# Patient Record
Sex: Female | Born: 1950
Health system: Southern US, Community
[De-identification: ages and names within clinical notes are randomized; demographics above are authoritative.]

## PROBLEM LIST (undated history)

## (undated) DIAGNOSIS — K219 Gastro-esophageal reflux disease without esophagitis: Secondary | ICD-10-CM

## (undated) DIAGNOSIS — F419 Anxiety disorder, unspecified: Secondary | ICD-10-CM

## (undated) DIAGNOSIS — F32A Depression, unspecified: Secondary | ICD-10-CM

## (undated) DIAGNOSIS — C801 Malignant (primary) neoplasm, unspecified: Secondary | ICD-10-CM

## (undated) DIAGNOSIS — F329 Major depressive disorder, single episode, unspecified: Secondary | ICD-10-CM

## (undated) HISTORY — DX: Depression, unspecified: F32.A

## (undated) HISTORY — PX: VAGINAL HYSTERECTOMY: SHX2639

## (undated) HISTORY — PX: ABDOMINAL HYSTERECTOMY: SHX81

## (undated) HISTORY — DX: Major depressive disorder, single episode, unspecified: F32.9

## (undated) HISTORY — DX: Anxiety disorder, unspecified: F41.9

## (undated) HISTORY — PX: CHOLECYSTECTOMY: SHX55

---

## 1989-08-26 DIAGNOSIS — C801 Malignant (primary) neoplasm, unspecified: Secondary | ICD-10-CM

## 1989-08-26 HISTORY — DX: Malignant (primary) neoplasm, unspecified: C80.1

## 2004-07-03 ENCOUNTER — Ambulatory Visit: Payer: Self-pay | Admitting: Ophthalmology

## 2015-03-24 ENCOUNTER — Other Ambulatory Visit: Payer: Self-pay

## 2015-03-28 ENCOUNTER — Ambulatory Visit (INDEPENDENT_AMBULATORY_CARE_PROVIDER_SITE_OTHER): Payer: Commercial Managed Care - HMO | Admitting: Family Medicine

## 2015-03-28 ENCOUNTER — Encounter: Payer: Self-pay | Admitting: Family Medicine

## 2015-03-28 VITALS — BP 120/80 | HR 80 | Ht 59.0 in | Wt 130.0 lb

## 2015-03-28 DIAGNOSIS — F331 Major depressive disorder, recurrent, moderate: Secondary | ICD-10-CM

## 2015-03-28 DIAGNOSIS — F419 Anxiety disorder, unspecified: Secondary | ICD-10-CM | POA: Insufficient documentation

## 2015-03-28 MED ORDER — AMITRIPTYLINE HCL 50 MG PO TABS: 50.0000 mg | ORAL_TABLET | Freq: Every day | ORAL | Status: DC

## 2015-03-28 MED ORDER — BUSPIRONE HCL 15 MG PO TABS: 15.0000 mg | ORAL_TABLET | Freq: Every day | ORAL | Status: DC

## 2015-03-28 MED ORDER — CITALOPRAM HYDROBROMIDE 20 MG PO TABS: 20.0000 mg | ORAL_TABLET | Freq: Every day | ORAL | Status: DC

## 2015-03-28 NOTE — Addendum Note (Signed)
Addended by: Juline Patch on: 03/28/2015 02:34 PM   Modules accepted: Orders

## 2015-03-28 NOTE — Progress Notes (Signed)
Name: Michelle Willis   MRN: [id number]    DOB: 01-15-1951   Date:03/28/2015       Progress Note  Subjective  Chief Complaint  Chief Complaint  Patient presents with  . Depression    moved from other doctor- wants referral to [facility] Problem The primary symptoms do not include dysphoric mood, delusions, hallucinations, bizarre behavior, disorganized speech, negative symptoms or somatic symptoms. The current episode started more than 1 month ago.  The onset of the illness is precipitated by emotional stress. The degree of incapacity that she is experiencing as a consequence of her illness is moderate. Additional symptoms of the illness do not include no anhedonia, no insomnia, no hypersomnia, no appetite change, no unexpected weight change, no fatigue, no agitation, no psychomotor retardation, no feelings of worthlessness, no attention impairment, no euphoric mood, no increased goal-directed activity, no flight of ideas, no inflated self-esteem, no decreased need for sleep, not distractible, no poor judgment, no visual change, no headaches, no abdominal pain or no seizures. She does not admit to suicidal ideas. She does not have a plan to commit suicide. She does not contemplate harming herself. Risk factors that are present for mental illness include a history of mental illness.  Anxiety Presents for follow-up visit. Onset was 1 to 5 years ago. Patient reports no chest pain, compulsions, confusion, decreased concentration, depressed mood, dizziness, dry mouth, excessive worry, feeling of choking, hyperventilation, impotence, insomnia, irritability, malaise, muscle tension, nausea, nervous/anxious behavior, obsessions, palpitations, panic, restlessness, shortness of breath or suicidal ideas. Symptoms occur most days. The severity of symptoms is moderate. Nothing aggravates the symptoms. The quality of sleep is good.   There are no known risk factors. There is no history of  anemia, anxiety/panic attacks, arrhythmia, asthma, bipolar disorder, CAD, CHF, chronic lung disease, depression, fibromyalgia, hyperthyroidism or suicide attempts. Past treatments include SSRIs and non-benzodiazephine anxiolytics. Compliance with prior treatments has been good.    No problem-specific assessment & plan notes found for this encounter.   Past Medical History  Diagnosis Date  . Anxiety   . Depression     Past Surgical History  Procedure Laterality Date  . Vaginal hysterectomy    . Cholecystectomy      Family History  Problem Relation Age of Onset  . Cancer Father     History   Social History  . Marital Status: Unknown    Spouse Name: N/A  . Number of Children: N/A  . Years of Education: N/A   Occupational History  . Not on file.   Social History Main Topics  . Smoking status: Never Smoker   . Smokeless tobacco: Not on file  . Alcohol Use: No  . Drug Use: No  . Sexual Activity: Not Currently   Other Topics Concern  . Not on file   Social History Narrative  . No narrative on file    No Known Allergies   Review of Systems  Constitutional: Negative for fever, chills, weight loss, malaise/fatigue, appetite change, irritability, fatigue and unexpected weight change.  HENT: Negative for ear discharge, ear pain and sore throat.   Eyes: Negative for blurred vision.  Respiratory: Negative for cough, sputum production, shortness of breath and wheezing.   Cardiovascular: Negative for chest pain, palpitations and leg swelling.  Gastrointestinal: Negative for heartburn, nausea, abdominal pain, diarrhea, constipation, blood in stool and melena.  Genitourinary: Negative for dysuria, urgency, frequency, hematuria and impotence.  Musculoskeletal: Negative for myalgias, back pain, joint  pain and neck pain.  Skin: Negative for rash.  Neurological: Negative for dizziness, tingling, sensory change, focal weakness, seizures and headaches.  Endo/Heme/Allergies:  Negative for environmental allergies and polydipsia. Does not bruise/bleed easily.  Psychiatric/Behavioral: Negative for depression, suicidal ideas, hallucinations, confusion, dysphoric mood, decreased concentration and agitation. The patient is not nervous/anxious and does not have insomnia.      Objective  Filed Vitals:   03/28/15 1342  BP: 120/80  Pulse: 80  Height: 4\' 11"  (1.499 m)  Weight: 130 lb (58.968 kg)    Physical Exam  Constitutional: She is well-developed, well-nourished, and in no distress. No distress.  HENT:  Head: Normocephalic and atraumatic.  Right Ear: External ear normal.  Left Ear: External ear normal.  Nose: Nose normal.  Mouth/Throat: Oropharynx is clear and moist.  Eyes: Conjunctivae and EOM are normal. Pupils are equal, round, and reactive to light. Right eye exhibits no discharge. Left eye exhibits no discharge.  Neck: Normal range of motion. Neck supple. No JVD present. No thyromegaly present.  Cardiovascular: Normal rate, regular rhythm, normal heart sounds and intact distal pulses.  Exam reveals no gallop and no friction rub.   No murmur heard. Pulmonary/Chest: Effort normal and breath sounds normal.  Abdominal: Soft. Bowel sounds are normal. She exhibits no mass. There is no tenderness. There is no guarding.  Musculoskeletal: Normal range of motion. She exhibits no edema.  Lymphadenopathy:    She has no cervical adenopathy.  Neurological: She is alert. She has normal reflexes.  Skin: Skin is warm and dry. She is not diaphoretic.  Psychiatric: Mood and affect normal.  Nursing note and vitals reviewed.     Assessment & Plan  Problem List Items Addressed This Visit      Other   Major depressive disorder, recurrent episode, moderate - Primary   Relevant Medications   amitriptyline (ELAVIL) 50 MG tablet   busPIRone (BUSPAR) 15 MG tablet   citalopram (CELEXA) 20 MG tablet   Acute anxiety   Relevant Medications   amitriptyline (ELAVIL) 50 MG  tablet   busPIRone (BUSPAR) 15 MG tablet   citalopram (CELEXA) 20 MG tablet        Dr. Letanya Froh [facility]  03/28/2015

## 2015-04-11 DIAGNOSIS — F331 Major depressive disorder, recurrent, moderate: Secondary | ICD-10-CM | POA: Diagnosis not present

## 2015-09-25 ENCOUNTER — Other Ambulatory Visit: Payer: Self-pay | Admitting: Family Medicine

## 2015-09-25 DIAGNOSIS — Z1231 Encounter for screening mammogram for malignant neoplasm of breast: Secondary | ICD-10-CM

## 2015-10-05 ENCOUNTER — Ambulatory Visit: Payer: Self-pay | Attending: Family Medicine

## 2016-01-31 ENCOUNTER — Ambulatory Visit: Payer: Self-pay | Admitting: Psychiatry

## 2016-02-15 ENCOUNTER — Telehealth: Payer: Self-pay

## 2016-02-15 ENCOUNTER — Ambulatory Visit (INDEPENDENT_AMBULATORY_CARE_PROVIDER_SITE_OTHER): Payer: Self-pay | Admitting: Psychiatry

## 2016-02-20 NOTE — Telephone Encounter (Signed)
error 

## 2016-04-10 ENCOUNTER — Other Ambulatory Visit: Payer: Self-pay | Admitting: Family Medicine

## 2016-04-10 DIAGNOSIS — F331 Major depressive disorder, recurrent, moderate: Secondary | ICD-10-CM

## 2016-04-15 ENCOUNTER — Ambulatory Visit: Payer: Self-pay | Admitting: Family Medicine

## 2016-04-26 ENCOUNTER — Ambulatory Visit: Payer: Self-pay | Admitting: Family Medicine

## 2016-06-05 ENCOUNTER — Ambulatory Visit: Payer: Self-pay | Admitting: Family Medicine

## 2016-06-25 ENCOUNTER — Ambulatory Visit (INDEPENDENT_AMBULATORY_CARE_PROVIDER_SITE_OTHER): Payer: Commercial Managed Care - HMO | Admitting: Family Medicine

## 2016-06-25 ENCOUNTER — Encounter: Payer: Self-pay | Admitting: Family Medicine

## 2016-06-25 VITALS — BP 130/98 | HR 88 | Ht 59.0 in | Wt 127.0 lb

## 2016-06-25 DIAGNOSIS — Z1231 Encounter for screening mammogram for malignant neoplasm of breast: Secondary | ICD-10-CM

## 2016-06-25 DIAGNOSIS — B354 Tinea corporis: Secondary | ICD-10-CM

## 2016-06-25 DIAGNOSIS — Z0000 Encounter for general adult medical examination without abnormal findings: Secondary | ICD-10-CM | POA: Diagnosis not present

## 2016-06-25 DIAGNOSIS — Z1239 Encounter for other screening for malignant neoplasm of breast: Secondary | ICD-10-CM

## 2016-06-25 DIAGNOSIS — I10 Essential (primary) hypertension: Secondary | ICD-10-CM | POA: Diagnosis not present

## 2016-06-25 MED ORDER — CLOTRIMAZOLE-BETAMETHASONE 1-0.05 % EX CREA: 1.0000 "application " | TOPICAL_CREAM | Freq: Two times a day (BID) | CUTANEOUS | 0 refills | Status: DC

## 2016-06-25 MED ORDER — LISINOPRIL 10 MG PO TABS: 10.0000 mg | ORAL_TABLET | Freq: Every day | ORAL | 2 refills | Status: DC

## 2016-06-25 NOTE — Progress Notes (Signed)
Name: Michelle Willis   MRN: [id number]    DOB: May 08, 1951   Date:06/25/2016       Progress Note  Subjective  Chief Complaint  Chief Complaint  Patient presents with  . Annual Exam    no pap/ mammo needed-- never had one  . Rash    across abdomen- itches    Patient needs breast exam with mammogram.   Rash  This is a new problem. The current episode started more than 1 month ago. The problem has been waxing and waning since onset. The affected locations include the chest. The rash is characterized by itchiness and redness. She was exposed to nothing. Pertinent negatives include no anorexia, congestion, cough, diarrhea, eye pain, facial edema, fatigue, fever, joint pain, nail changes, rhinorrhea, shortness of breath, sore throat or vomiting. Past treatments include topical steroids. The treatment provided no relief.  Hypertension  This is a recurrent problem. The current episode started more than 1 year ago. The problem has been waxing and waning since onset. The problem is controlled. Pertinent negatives include no anxiety, blurred vision, chest pain, headaches, malaise/fatigue, neck pain, orthopnea, palpitations, peripheral edema, PND or shortness of breath. There are no associated agents to hypertension. Past treatments include nothing.    No problem-specific Assessment & Plan notes found for this encounter.   Past Medical History:  Diagnosis Date  . Anxiety   . Depression     Past Surgical History:  Procedure Laterality Date  . CHOLECYSTECTOMY    . VAGINAL HYSTERECTOMY      Family History  Problem Relation Age of Onset  . Cancer Father     Social History   Social History  . Marital status: Unknown    Spouse name: N/A  . Number of children: N/A  . Years of education: N/A   Occupational History  . Not on file.   Social History Main Topics  . Smoking status: Never Smoker  . Smokeless tobacco: Never Used  . Alcohol use No  . Drug use: No  . Sexual activity: Not  Currently   Other Topics Concern  . Not on file   Social History Narrative  . No narrative on file    No Known Allergies   Review of Systems  Constitutional: Negative for chills, fatigue, fever, malaise/fatigue and weight loss.  HENT: Negative for congestion, ear discharge, ear pain, rhinorrhea and sore throat.   Eyes: Negative for blurred vision and pain.  Respiratory: Negative for cough, sputum production, shortness of breath and wheezing.   Cardiovascular: Negative for chest pain, palpitations, orthopnea, leg swelling and PND.  Gastrointestinal: Negative for abdominal pain, anorexia, blood in stool, constipation, diarrhea, heartburn, melena, nausea and vomiting.  Genitourinary: Negative for dysuria, frequency, hematuria and urgency.  Musculoskeletal: Negative for back pain, joint pain, myalgias and neck pain.  Skin: Positive for rash. Negative for nail changes.  Neurological: Negative for dizziness, tingling, sensory change, focal weakness and headaches.  Endo/Heme/Allergies: Negative for environmental allergies and polydipsia. Does not bruise/bleed easily.  Psychiatric/Behavioral: Negative for depression and suicidal ideas. The patient is not nervous/anxious and does not have insomnia.      Objective  Vitals:   06/25/16 0949  BP: (!) 130/98  Pulse: 88  Weight: 127 lb (57.6 kg)  Height: 4\' 11"  (1.499 m)    Physical Exam  Constitutional: She is well-developed, well-nourished, and in no distress. No distress.  HENT:  Head: Normocephalic and atraumatic.  Right Ear: Tympanic membrane, external ear and ear canal normal.  Left Ear: Tympanic membrane, external ear and ear canal normal.  Nose: Nose normal.  Mouth/Throat: Oropharynx is clear and moist.  Eyes: Conjunctivae and EOM are normal. Pupils are equal, round, and reactive to light. Right eye exhibits no discharge. Left eye exhibits no discharge.  Neck: Normal range of motion. Neck supple. No JVD present. No thyromegaly  present.  Cardiovascular: Normal rate, regular rhythm, normal heart sounds and intact distal pulses.  Exam reveals no gallop and no friction rub.   No murmur heard. Pulmonary/Chest: Effort normal and breath sounds normal. She has no wheezes. She has no rales. Right breast exhibits no inverted nipple, no mass, no nipple discharge, no skin change and no tenderness. Left breast exhibits no inverted nipple, no mass, no nipple discharge, no skin change and no tenderness. Breasts are symmetrical.  Abdominal: Soft. Bowel sounds are normal. She exhibits no mass. There is no tenderness. There is no guarding.  Musculoskeletal: Normal range of motion. She exhibits no edema.  Lymphadenopathy:    She has no cervical adenopathy.  Neurological: She is alert. She has normal reflexes.  Skin: Skin is warm and dry. Rash noted. She is not diaphoretic. There is erythema.  Psychiatric: Mood and affect normal.  Nursing note and vitals reviewed.     Assessment & Plan  Problem List Items Addressed This Visit    None    Visit Diagnoses    Essential hypertension    -  Primary   Relevant Medications   lisinopril (PRINIVIL,ZESTRIL) 10 MG tablet   Other Relevant Orders   Renal Function Panel   Tinea corporis       Relevant Medications   clotrimazole-betamethasone (LOTRISONE) cream   Breast cancer screening       Relevant Orders   MM Digital Screening        Dr. Otilio Miu [facility]  06/25/16

## 2016-06-26 LAB — RENAL FUNCTION PANEL
ALBUMIN: 4.4 g/dL (ref 3.6–4.8)
BUN/Creatinine Ratio: 19 (ref 12–28)
BUN: 18 mg/dL (ref 8–27)
CALCIUM: 9.7 mg/dL (ref 8.7–10.3)
CO2: 27 mmol/L (ref 18–29)
CREATININE: 0.93 mg/dL (ref 0.57–1.00)
Chloride: 101 mmol/L (ref 96–106)
GFR calc Af Amer: 74 mL/min/{1.73_m2} (ref >59)
GFR, EST NON AFRICAN AMERICAN: 64 mL/min/{1.73_m2} (ref >59)
Glucose: 86 mg/dL (ref 65–99)
PHOSPHORUS: 3.6 mg/dL (ref 2.5–4.5)
Potassium: 4.3 mmol/L (ref 3.5–5.2)
SODIUM: 144 mmol/L (ref 134–144)

## 2016-07-03 ENCOUNTER — Ambulatory Visit: Admission: RE | Admit: 2016-07-03 | Payer: Commercial Managed Care - HMO | Source: Ambulatory Visit

## 2016-07-08 ENCOUNTER — Ambulatory Visit
Admission: RE | Admit: 2016-07-08 | Discharge: 2016-07-08 | Disposition: A | Discharge status: Home / Self Care | Payer: Commercial Managed Care - HMO | Source: Ambulatory Visit | Attending: Family Medicine | Admitting: Family Medicine

## 2016-07-08 DIAGNOSIS — Z1231 Encounter for screening mammogram for malignant neoplasm of breast: Secondary | ICD-10-CM | POA: Diagnosis not present

## 2016-07-08 DIAGNOSIS — Z1239 Encounter for other screening for malignant neoplasm of breast: Secondary | ICD-10-CM

## 2016-08-06 ENCOUNTER — Other Ambulatory Visit: Payer: Self-pay

## 2016-08-06 DIAGNOSIS — F331 Major depressive disorder, recurrent, moderate: Secondary | ICD-10-CM

## 2016-08-06 DIAGNOSIS — F419 Anxiety disorder, unspecified: Secondary | ICD-10-CM

## 2016-08-07 ENCOUNTER — Telehealth: Payer: Self-pay

## 2016-08-07 NOTE — Telephone Encounter (Signed)
Pt called wanting 3 psych meds refilled- was told we could fill x 30 days but needs to get back in with psych per Dr Ronnald Ramp- sent Rx to [facility]

## 2016-08-08 ENCOUNTER — Ambulatory Visit (INDEPENDENT_AMBULATORY_CARE_PROVIDER_SITE_OTHER): Payer: Commercial Managed Care - HMO | Admitting: Family Medicine

## 2016-08-08 ENCOUNTER — Encounter: Payer: Self-pay | Admitting: Family Medicine

## 2016-08-08 VITALS — BP 130/90 | HR 80 | Ht 59.0 in | Wt 128.0 lb

## 2016-08-08 DIAGNOSIS — I10 Essential (primary) hypertension: Secondary | ICD-10-CM | POA: Diagnosis not present

## 2016-08-08 DIAGNOSIS — F331 Major depressive disorder, recurrent, moderate: Secondary | ICD-10-CM

## 2016-08-08 MED ORDER — LISINOPRIL 5 MG PO TABS: 5.0000 mg | ORAL_TABLET | Freq: Every day | ORAL | 1 refills | Status: DC

## 2016-08-08 MED ORDER — CITALOPRAM HYDROBROMIDE 10 MG PO TABS
10.0000 mg | ORAL_TABLET | Freq: Every day | ORAL | 1 refills | Status: DC
Start: 2016-08-08 — End: 2016-09-30

## 2016-08-08 MED ORDER — AMITRIPTYLINE HCL 50 MG PO TABS: 50.0000 mg | ORAL_TABLET | Freq: Every day | ORAL | 5 refills | Status: DC

## 2016-08-08 NOTE — Progress Notes (Signed)
Name: Michelle Willis   MRN: [id number]    DOB: [birth year]   Date:08/08/2016       Progress Note  Subjective  Chief Complaint  Chief Complaint  Patient presents with  . Dizziness    when stands up from laying down on the couch- gets dizzy     Dizziness  This is a new problem. The current episode started more than 1 month ago. The problem occurs daily. The problem has been gradually improving. Pertinent negatives include no abdominal pain, anorexia, arthralgias, change in bowel habit, chest pain, chills, congestion, coughing, diaphoresis, fatigue, fever, headaches, joint swelling, myalgias, nausea, neck pain, numbness, rash, sore throat, swollen glands, urinary symptoms, vertigo, visual change, vomiting or weakness. Nothing aggravates the symptoms. She has tried nothing for the symptoms. The treatment provided no relief.    No problem-specific Assessment & Plan notes found for this encounter.   Past Medical History:  Diagnosis Date  . Anxiety   . Depression     Past Surgical History:  Procedure Laterality Date  . ABDOMINAL HYSTERECTOMY    . CHOLECYSTECTOMY    . VAGINAL HYSTERECTOMY      Family History  Problem Relation Age of Onset  . Cancer Father   . Breast cancer Neg Hx     Social History   Social History  . Marital status: Unknown    Spouse name: N/A  . Number of children: N/A  . Years of education: N/A   Occupational History  . Not on file.   Social History Main Topics  . Smoking status: Never Smoker  . Smokeless tobacco: Never Used  . Alcohol use No  . Drug use: No  . Sexual activity: Not Currently   Other Topics Concern  . Not on file   Social History Narrative  . No narrative on file    No Known Allergies   Review of Systems  Constitutional: Negative for chills, diaphoresis, fatigue, fever, malaise/fatigue and weight loss.  HENT: Negative for congestion, ear discharge, ear pain and sore throat.   Eyes: Negative for blurred vision.   Respiratory: Negative for cough, sputum production, shortness of breath and wheezing.   Cardiovascular: Negative for chest pain, palpitations and leg swelling.  Gastrointestinal: Negative for abdominal pain, anorexia, blood in stool, change in bowel habit, constipation, diarrhea, heartburn, melena, nausea and vomiting.  Genitourinary: Negative for dysuria, frequency, hematuria and urgency.  Musculoskeletal: Negative for arthralgias, back pain, joint pain, joint swelling, myalgias and neck pain.  Skin: Negative for rash.  Neurological: Positive for dizziness. Negative for vertigo, tingling, sensory change, focal weakness, weakness, numbness and headaches.  Endo/Heme/Allergies: Negative for environmental allergies and polydipsia. Does not bruise/bleed easily.  Psychiatric/Behavioral: Negative for depression and suicidal ideas. The patient is not nervous/anxious and does not have insomnia.      Objective  Vitals:   08/08/16 1344  BP: 130/90  Pulse: 80  Weight: 128 lb (58.1 kg)  Height: 4\' 11"  (1.499 m)    Physical Exam  Constitutional: She is well-developed, well-nourished, and in no distress. No distress.  HENT:  Head: Normocephalic and atraumatic.  Right Ear: External ear normal.  Left Ear: External ear normal.  Nose: Nose normal.  Mouth/Throat: Oropharynx is clear and moist.  Eyes: Conjunctivae and EOM are normal. Pupils are equal, round, and reactive to light. Right eye exhibits no discharge. Left eye exhibits no discharge.  Neck: Normal range of motion. Neck supple. No JVD present. No thyromegaly present.  Cardiovascular: Normal rate, regular rhythm,  normal heart sounds and intact distal pulses.  Exam reveals no gallop and no friction rub.   No murmur heard. Pulmonary/Chest: Effort normal and breath sounds normal.  Abdominal: Soft. Bowel sounds are normal. She exhibits no mass. There is no tenderness. There is no guarding.  Musculoskeletal: Normal range of motion. She exhibits  no edema.  Lymphadenopathy:    She has no cervical adenopathy.  Neurological: She is alert.  Skin: Skin is warm and dry. She is not diaphoretic.  Psychiatric: Mood and affect normal.  Nursing note and vitals reviewed.     Assessment & Plan  Problem List Items Addressed This Visit    None    Visit Diagnoses    Essential hypertension    -  Primary   Relevant Medications   lisinopril (PRINIVIL,ZESTRIL) 5 MG tablet   Major depressive disorder, recurrent episode, moderate (HCC)       referral to [facility]   Relevant Medications   citalopram (CELEXA) 10 MG tablet   amitriptyline (ELAVIL) 50 MG tablet        Dr. [facility]  08/08/16

## 2016-09-19 ENCOUNTER — Ambulatory Visit: Payer: Medicare HMO | Admitting: Family Medicine

## 2016-09-30 ENCOUNTER — Ambulatory Visit (INDEPENDENT_AMBULATORY_CARE_PROVIDER_SITE_OTHER): Payer: Medicare HMO | Admitting: Family Medicine

## 2016-09-30 ENCOUNTER — Encounter: Payer: Self-pay | Admitting: Family Medicine

## 2016-09-30 VITALS — BP 110/82 | HR 80 | Ht 59.0 in | Wt 129.0 lb

## 2016-09-30 DIAGNOSIS — I10 Essential (primary) hypertension: Secondary | ICD-10-CM | POA: Diagnosis not present

## 2016-09-30 DIAGNOSIS — F419 Anxiety disorder, unspecified: Secondary | ICD-10-CM

## 2016-09-30 DIAGNOSIS — F418 Other specified anxiety disorders: Secondary | ICD-10-CM

## 2016-09-30 DIAGNOSIS — F331 Major depressive disorder, recurrent, moderate: Secondary | ICD-10-CM

## 2016-09-30 DIAGNOSIS — F411 Generalized anxiety disorder: Secondary | ICD-10-CM | POA: Insufficient documentation

## 2016-09-30 DIAGNOSIS — F32A Depression, unspecified: Secondary | ICD-10-CM | POA: Insufficient documentation

## 2016-09-30 DIAGNOSIS — F329 Major depressive disorder, single episode, unspecified: Secondary | ICD-10-CM

## 2016-09-30 MED ORDER — AMITRIPTYLINE HCL 50 MG PO TABS: 50.0000 mg | ORAL_TABLET | Freq: Every day | ORAL | 1 refills | Status: DC

## 2016-09-30 MED ORDER — LISINOPRIL 5 MG PO TABS: 5.0000 mg | ORAL_TABLET | Freq: Every day | ORAL | 1 refills | Status: DC

## 2016-09-30 NOTE — Progress Notes (Signed)
Name: Michelle Willis   MRN: [id number]    DOB: [birth year]   Date:09/30/2016       Progress Note  Subjective  Chief Complaint  Chief Complaint  Patient presents with  . Hypertension  . Depression    stopped citalopram and buspar x 6 weeks ago- doesn't feel depressed anymore- you had wanted her to get in with psych/ sent referral- pt "doesn't want to go"    Hypertension  This is a chronic problem. The current episode started more than 1 year ago. The problem has been gradually improving since onset. The problem is controlled. Pertinent negatives include no anxiety, blurred vision, chest pain, headaches, malaise/fatigue, neck pain, orthopnea, palpitations, peripheral edema, PND, shortness of breath or sweats. There are no associated agents to hypertension. There are no known risk factors for coronary artery disease. Past treatments include ACE inhibitors. The current treatment provides mild improvement. There are no compliance problems.  There is no history of angina, kidney disease, CAD/MI, CVA, heart failure, left ventricular hypertrophy, PVD, renovascular disease or retinopathy. There is no history of chronic renal disease or a hypertension causing med.  Depression         This is a chronic problem.  The current episode started more than 1 year ago.   The onset quality is gradual.   The problem occurs intermittently.  Associated symptoms include no decreased concentration, no fatigue, no helplessness, no hopelessness, does not have insomnia, not irritable, no restlessness, no decreased interest, no appetite change, no body aches, no myalgias, no headaches, no indigestion, not sad and no suicidal ideas.     The symptoms are aggravated by nothing.  Past treatments include TCAs - Tricyclic antidepressants.  Compliance with treatment is variable (pt stopped celexa and buspar).  Previous treatment provided mild relief.   Pertinent negatives include no anxiety.   No problem-specific Assessment & Plan  notes found for this encounter.   Past Medical History:  Diagnosis Date  . Anxiety   . Depression     Past Surgical History:  Procedure Laterality Date  . ABDOMINAL HYSTERECTOMY    . CHOLECYSTECTOMY    . VAGINAL HYSTERECTOMY      Family History  Problem Relation Age of Onset  . Cancer Father   . Breast cancer Neg Hx     Social History   Social History  . Marital status: Unknown    Spouse name: N/A  . Number of children: N/A  . Years of education: N/A   Occupational History  . Not on file.   Social History Main Topics  . Smoking status: Never Smoker  . Smokeless tobacco: Never Used  . Alcohol use No  . Drug use: No  . Sexual activity: Not Currently   Other Topics Concern  . Not on file   Social History Narrative  . No narrative on file    No Known Allergies   Review of Systems  Constitutional: Negative for appetite change, chills, fatigue, fever, malaise/fatigue and weight loss.  HENT: Negative for ear discharge, ear pain and sore throat.   Eyes: Negative for blurred vision.  Respiratory: Negative for cough, sputum production, shortness of breath and wheezing.   Cardiovascular: Negative for chest pain, palpitations, orthopnea, leg swelling and PND.  Gastrointestinal: Negative for abdominal pain, blood in stool, constipation, diarrhea, heartburn, melena and nausea.  Genitourinary: Negative for dysuria, frequency, hematuria and urgency.  Musculoskeletal: Negative for back pain, joint pain, myalgias and neck pain.  Skin: Negative for rash.  Neurological: Negative for dizziness, tingling, sensory change, focal weakness and headaches.  Endo/Heme/Allergies: Negative for environmental allergies and polydipsia. Does not bruise/bleed easily.  Psychiatric/Behavioral: Positive for depression. Negative for decreased concentration and suicidal ideas. The patient is not nervous/anxious and does not have insomnia.      Objective  Vitals:   09/30/16 0931  BP:  110/82  Pulse: 80  Weight: 129 lb (58.5 kg)  Height: 4\' 11"  (1.499 m)    Physical Exam  Constitutional: She is well-developed, well-nourished, and in no distress. She is not irritable. No distress.  HENT:  Head: Normocephalic and atraumatic.  Right Ear: External ear normal.  Left Ear: External ear normal.  Nose: Nose normal.  Mouth/Throat: Oropharynx is clear and moist.  Eyes: Conjunctivae and EOM are normal. Pupils are equal, round, and reactive to light. Right eye exhibits no discharge. Left eye exhibits no discharge.  Neck: Normal range of motion. Neck supple. No JVD present. No thyromegaly present.  Cardiovascular: Normal rate, regular rhythm, normal heart sounds and intact distal pulses.  Exam reveals no gallop and no friction rub.   No murmur heard. Pulmonary/Chest: Effort normal and breath sounds normal.  Abdominal: Soft. Bowel sounds are normal. She exhibits no mass. There is no tenderness. There is no guarding.  Musculoskeletal: Normal range of motion. She exhibits no edema.  Lymphadenopathy:    She has no cervical adenopathy.  Neurological: She is alert.  Skin: Skin is warm and dry. She is not diaphoretic.  Psychiatric: Mood and affect normal.  Nursing note and vitals reviewed.     Assessment & Plan  Problem List Items Addressed This Visit      Cardiovascular and Mediastinum   Essential hypertension - Primary   Relevant Medications   lisinopril (PRINIVIL,ZESTRIL) 5 MG tablet     Other   Anxiety and depression   Relevant Medications   amitriptyline (ELAVIL) 50 MG tablet    Other Visit Diagnoses    Major depressive disorder, recurrent episode, moderate ([place])       patient self stopped celexa and buspar   Relevant Medications   amitriptyline (ELAVIL) 50 MG tablet        Dr. Deanna Jones [facility]  09/30/16

## 2016-12-24 ENCOUNTER — Ambulatory Visit: Payer: Self-pay | Admitting: Family Medicine

## 2017-03-13 ENCOUNTER — Encounter: Payer: Self-pay | Admitting: Family Medicine

## 2017-03-13 ENCOUNTER — Ambulatory Visit (INDEPENDENT_AMBULATORY_CARE_PROVIDER_SITE_OTHER): Payer: Medicare HMO | Admitting: Family Medicine

## 2017-03-13 VITALS — BP 130/80 | HR 80 | Ht 59.0 in | Wt 126.0 lb

## 2017-03-13 DIAGNOSIS — I10 Essential (primary) hypertension: Secondary | ICD-10-CM

## 2017-03-13 DIAGNOSIS — F329 Major depressive disorder, single episode, unspecified: Secondary | ICD-10-CM

## 2017-03-13 DIAGNOSIS — F5101 Primary insomnia: Secondary | ICD-10-CM | POA: Diagnosis not present

## 2017-03-13 DIAGNOSIS — F331 Major depressive disorder, recurrent, moderate: Secondary | ICD-10-CM | POA: Diagnosis not present

## 2017-03-13 DIAGNOSIS — F32A Depression, unspecified: Secondary | ICD-10-CM

## 2017-03-13 DIAGNOSIS — F419 Anxiety disorder, unspecified: Secondary | ICD-10-CM

## 2017-03-13 MED ORDER — AMITRIPTYLINE HCL 50 MG PO TABS: 50.0000 mg | ORAL_TABLET | Freq: Every day | ORAL | 1 refills | Status: DC

## 2017-03-13 MED ORDER — LISINOPRIL 5 MG PO TABS: 5.0000 mg | ORAL_TABLET | Freq: Every day | ORAL | 1 refills | Status: DC

## 2017-03-13 NOTE — Progress Notes (Signed)
Name: Michelle Willis   MRN: [id number]    DOB: 01/11/1951   Date:03/13/2017       Progress Note  Subjective  Chief Complaint  Chief Complaint  Patient presents with  . Hypertension  . Insomnia    takes amitrypt.- needs refill    Hypertension  This is a chronic problem. The current episode started more than 1 year ago. The problem is unchanged. The problem is controlled. Pertinent negatives include no anxiety, blurred vision, chest pain, headaches, malaise/fatigue, neck pain, orthopnea, palpitations, peripheral edema, PND, shortness of breath or sweats. There are no associated agents to hypertension. Past treatments include ACE inhibitors. The current treatment provides mild improvement. There are no compliance problems.  There is no history of angina, kidney disease, CVA, heart failure, left ventricular hypertrophy, PVD or retinopathy. There is no history of chronic renal disease, a hypertension causing med or renovascular disease.  Insomnia  Primary symptoms: no malaise/fatigue.  The onset quality is gradual. The problem has been gradually improving (on amitryptaline) since onset. Past treatments include medication. The treatment provided moderate relief. PMH includes: no depression.    No problem-specific Assessment & Plan notes found for this encounter.   Past Medical History:  Diagnosis Date  . Anxiety   . Depression     Past Surgical History:  Procedure Laterality Date  . ABDOMINAL HYSTERECTOMY    . CHOLECYSTECTOMY    . VAGINAL HYSTERECTOMY      Family History  Problem Relation Age of Onset  . Cancer Father   . Breast cancer Neg Hx     Social History   Social History  . Marital status: Unknown    Spouse name: N/A  . Number of children: N/A  . Years of education: N/A   Occupational History  . Not on file.   Social History Main Topics  . Smoking status: Never Smoker  . Smokeless tobacco: Never Used  . Alcohol use No  . Drug use: No  . Sexual activity: Not  Currently   Other Topics Concern  . Not on file   Social History Narrative  . No narrative on file    No Known Allergies  Outpatient Medications Prior to Visit  Medication Sig Dispense Refill  . Ascorbic Acid (VITAMIN C ADULT GUMMIES PO)     . aspirin 325 MG tablet Take 1 tablet by mouth as needed.    . calcium elemental as carbonate (BARIATRIC TUMS ULTRA) 400 MG tablet Chew 1 tablet by mouth daily at 12 noon.    . Cholecalciferol (VITAMIN D3 ADULT GUMMIES PO)     . FIBER COMPLETE PO Take 2 tablets by mouth daily at 12 noon.    Marland Kitchen amitriptyline (ELAVIL) 50 MG tablet Take 1 tablet (50 mg total) by mouth at bedtime. 90 tablet 1  . lisinopril (PRINIVIL,ZESTRIL) 5 MG tablet Take 1 tablet (5 mg total) by mouth daily. 90 tablet 1   No facility-administered medications prior to visit.     Review of Systems  Constitutional: Negative for chills, fever, malaise/fatigue and weight loss.  HENT: Negative for ear discharge, ear pain and sore throat.   Eyes: Negative for blurred vision.  Respiratory: Negative for cough, sputum production, shortness of breath and wheezing.   Cardiovascular: Negative for chest pain, palpitations, orthopnea, leg swelling and PND.  Gastrointestinal: Negative for abdominal pain, blood in stool, constipation, diarrhea, heartburn, melena and nausea.  Genitourinary: Negative for dysuria, frequency, hematuria and urgency.  Musculoskeletal: Negative for back pain, joint pain,  myalgias and neck pain.  Skin: Negative for rash.  Neurological: Negative for dizziness, tingling, sensory change, focal weakness and headaches.  Endo/Heme/Allergies: Negative for environmental allergies and polydipsia. Does not bruise/bleed easily.  Psychiatric/Behavioral: Negative for depression and suicidal ideas. The patient has insomnia. The patient is not nervous/anxious.      Objective  Vitals:   03/13/17 1121  BP: 130/80  Pulse: 80  Weight: 126 lb (57.2 kg)  Height: 4\' 11"  (1.499 m)     Physical Exam  Constitutional: She is well-developed, well-nourished, and in no distress. No distress.  HENT:  Head: Normocephalic and atraumatic.  Right Ear: External ear normal.  Left Ear: External ear normal.  Nose: Nose normal.  Mouth/Throat: Oropharynx is clear and moist.  Eyes: Pupils are equal, round, and reactive to light. Conjunctivae and EOM are normal. Right eye exhibits no discharge. Left eye exhibits no discharge.  Neck: Normal range of motion. Neck supple. No JVD present. No thyromegaly present.  Cardiovascular: Normal rate, regular rhythm, normal heart sounds and intact distal pulses.  Exam reveals no gallop and no friction rub.   No murmur heard. Pulmonary/Chest: Effort normal and breath sounds normal. She has no wheezes. She has no rales.  Abdominal: Soft. Bowel sounds are normal. She exhibits no mass. There is no tenderness. There is no guarding.  Musculoskeletal: Normal range of motion. She exhibits no edema.  Lymphadenopathy:    She has no cervical adenopathy.  Neurological: She is alert. She has normal reflexes.  Skin: Skin is warm and dry. She is not diaphoretic.  Psychiatric: Mood and affect normal.  Nursing note and vitals reviewed.     Assessment & Plan  Problem List Items Addressed This Visit      Cardiovascular and Mediastinum   Essential hypertension - Primary   Relevant Medications   lisinopril (PRINIVIL,ZESTRIL) 5 MG tablet     Other   Anxiety and depression   Relevant Medications   amitriptyline (ELAVIL) 50 MG tablet   Primary insomnia    Other Visit Diagnoses    Major depressive disorder, recurrent episode, moderate (HCC)       patient self stopped celexa and buspar   Relevant Medications   amitriptyline (ELAVIL) 50 MG tablet      Meds ordered this encounter  Medications  . amitriptyline (ELAVIL) 50 MG tablet    Sig: Take 1 tablet (50 mg total) by mouth at bedtime.    Dispense:  90 tablet    Refill:  1  . lisinopril  (PRINIVIL,ZESTRIL) 5 MG tablet    Sig: Take 1 tablet (5 mg total) by mouth daily.    Dispense:  90 tablet    Refill:  1      Dr. Otilio Miu [facility]  03/13/17

## 2017-04-01 ENCOUNTER — Encounter: Payer: Self-pay | Admitting: Family Medicine

## 2017-04-01 ENCOUNTER — Ambulatory Visit (INDEPENDENT_AMBULATORY_CARE_PROVIDER_SITE_OTHER): Payer: Medicare HMO | Admitting: Family Medicine

## 2017-04-01 VITALS — BP 100/78 | HR 80 | Ht 59.0 in | Wt 125.0 lb

## 2017-04-01 DIAGNOSIS — N3090 Cystitis, unspecified without hematuria: Secondary | ICD-10-CM | POA: Diagnosis not present

## 2017-04-01 DIAGNOSIS — R350 Frequency of micturition: Secondary | ICD-10-CM | POA: Diagnosis not present

## 2017-04-01 LAB — POCT URINALYSIS DIPSTICK
Bilirubin, UA: NEGATIVE
Glucose, UA: NEGATIVE
Ketones, UA: NEGATIVE
Nitrite, UA: POSITIVE
SPEC GRAV UA: 1.01 (ref 1.010–1.025)
UROBILINOGEN UA: 0.2 U/dL
pH, UA: 7 (ref 5.0–8.0)

## 2017-04-01 MED ORDER — SULFAMETHOXAZOLE-TRIMETHOPRIM 800-160 MG PO TABS: 1.0000 | ORAL_TABLET | Freq: Two times a day (BID) | ORAL | 0 refills | Status: DC

## 2017-04-01 NOTE — Progress Notes (Signed)
Name: Michelle Willis   MRN: [id number]    DOB: 22-Feb-1951   Date:04/01/2017       Progress Note  Subjective  Chief Complaint  Chief Complaint  Patient presents with  . Urinary Tract Infection    frequent urination, hurting when using BR, can't urinate alot when she does go    Urinary Tract Infection   This is a new problem. The current episode started in the past 7 days. The problem occurs every urination. The problem has been gradually worsening. The quality of the pain is described as burning. The pain is moderate. There has been no fever. Pertinent negatives include no chills, discharge, flank pain, frequency, hematuria, hesitancy, nausea, possible pregnancy, sweats, urgency or vomiting. She has tried nothing for the symptoms. The treatment provided mild relief. There is no history of catheterization, kidney stones, recurrent UTIs, a single kidney, urinary stasis or a urological procedure.    No problem-specific Assessment & Plan notes found for this encounter.   Past Medical History:  Diagnosis Date  . Anxiety   . Depression     Past Surgical History:  Procedure Laterality Date  . ABDOMINAL HYSTERECTOMY    . CHOLECYSTECTOMY    . VAGINAL HYSTERECTOMY      Family History  Problem Relation Age of Onset  . Cancer Father   . Breast cancer Neg Hx     Social History   Social History  . Marital status: Unknown    Spouse name: N/A  . Number of children: N/A  . Years of education: N/A   Occupational History  . Not on file.   Social History Main Topics  . Smoking status: Never Smoker  . Smokeless tobacco: Never Used  . Alcohol use No  . Drug use: No  . Sexual activity: Not Currently   Other Topics Concern  . Not on file   Social History Narrative  . No narrative on file    No Known Allergies  Outpatient Medications Prior to Visit  Medication Sig Dispense Refill  . amitriptyline (ELAVIL) 50 MG tablet Take 1 tablet (50 mg total) by mouth at bedtime. 90 tablet 1   . Ascorbic Acid (VITAMIN C ADULT GUMMIES PO)     . aspirin 325 MG tablet Take 1 tablet by mouth as needed.    . calcium elemental as carbonate (BARIATRIC TUMS ULTRA) 400 MG tablet Chew 1 tablet by mouth daily at 12 noon.    . Cholecalciferol (VITAMIN D3 ADULT GUMMIES PO)     . FIBER COMPLETE PO Take 2 tablets by mouth daily at 12 noon.    Marland Kitchen lisinopril (PRINIVIL,ZESTRIL) 5 MG tablet Take 1 tablet (5 mg total) by mouth daily. 90 tablet 1   No facility-administered medications prior to visit.     Review of Systems  Constitutional: Negative for chills, fever, malaise/fatigue and weight loss.  HENT: Negative for ear discharge, ear pain and sore throat.   Eyes: Negative for blurred vision.  Respiratory: Negative for cough, sputum production, shortness of breath and wheezing.   Cardiovascular: Negative for chest pain, palpitations and leg swelling.  Gastrointestinal: Negative for abdominal pain, blood in stool, constipation, diarrhea, heartburn, melena, nausea and vomiting.  Genitourinary: Negative for dysuria, flank pain, frequency, hematuria, hesitancy and urgency.  Musculoskeletal: Negative for back pain, joint pain, myalgias and neck pain.  Skin: Negative for rash.  Neurological: Negative for dizziness, tingling, sensory change, focal weakness and headaches.  Endo/Heme/Allergies: Negative for environmental allergies and polydipsia. Does not bruise/bleed  easily.  Psychiatric/Behavioral: Negative for depression and suicidal ideas. The patient is not nervous/anxious and does not have insomnia.      Objective  Vitals:   04/01/17 0918  BP: 100/78  Pulse: 80  Weight: 125 lb (56.7 kg)  Height: 4\' 11"  (1.499 m)    Physical Exam  Constitutional: She is well-developed, well-nourished, and in no distress. No distress.  HENT:  Head: Normocephalic and atraumatic.  Right Ear: External ear normal.  Left Ear: External ear normal.  Nose: Nose normal.  Mouth/Throat: Oropharynx is clear and  moist.  Eyes: Pupils are equal, round, and reactive to light. Conjunctivae and EOM are normal. Right eye exhibits no discharge. Left eye exhibits no discharge.  Neck: Normal range of motion. Neck supple. No JVD present. No thyromegaly present.  Cardiovascular: Normal rate, regular rhythm, normal heart sounds and intact distal pulses.  Exam reveals no gallop and no friction rub.   No murmur heard. Pulmonary/Chest: Effort normal and breath sounds normal. She has no wheezes. She has no rales.  Abdominal: Soft. Bowel sounds are normal. She exhibits no mass. There is no tenderness. There is no guarding.  Musculoskeletal: Normal range of motion. She exhibits no edema.  Lymphadenopathy:    She has no cervical adenopathy.  Neurological: She is alert. She has normal reflexes.  Skin: Skin is warm and dry. She is not diaphoretic.  Psychiatric: Mood and affect normal.  Nursing note and vitals reviewed.     Assessment & Plan  Problem List Items Addressed This Visit    None    Visit Diagnoses    Urinary frequency    -  Primary   Relevant Orders   POCT urinalysis dipstick (Completed)   Cystitis          Meds ordered this encounter  Medications  . sulfamethoxazole-trimethoprim (BACTRIM DS,SEPTRA DS) 800-160 MG tablet    Sig: Take 1 tablet by mouth 2 (two) times daily.    Dispense:  10 tablet    Refill:  0      Dr. Amerika Nourse [facility]  04/01/17

## 2017-04-09 ENCOUNTER — Encounter: Payer: Self-pay | Admitting: Family Medicine

## 2017-04-09 ENCOUNTER — Ambulatory Visit (INDEPENDENT_AMBULATORY_CARE_PROVIDER_SITE_OTHER): Payer: Medicare HMO | Admitting: Family Medicine

## 2017-04-09 VITALS — BP 100/62 | HR 80 | Ht 59.0 in | Wt 125.0 lb

## 2017-04-09 DIAGNOSIS — F419 Anxiety disorder, unspecified: Secondary | ICD-10-CM | POA: Diagnosis not present

## 2017-04-09 DIAGNOSIS — F329 Major depressive disorder, single episode, unspecified: Secondary | ICD-10-CM

## 2017-04-09 MED ORDER — CITALOPRAM HYDROBROMIDE 20 MG PO TABS: 20.0000 mg | ORAL_TABLET | Freq: Every day | ORAL | 0 refills | Status: DC

## 2017-04-09 NOTE — Progress Notes (Signed)
Name: Michelle Willis   MRN: [id number]    DOB: Apr 10, 1951   Date:04/09/2017       Progress Note  Subjective  Chief Complaint  Chief Complaint  Patient presents with  . Depression    Depression       The patient presents with depression.  This is a recurrent problem.  The current episode started more than 1 year ago.   The onset quality is gradual.   The problem occurs constantly.  The most recent episode lasted 4 months.    The problem has been gradually worsening since onset.  Associated symptoms include decreased concentration, fatigue, helplessness, hopelessness, irritable, restlessness, decreased interest, indigestion and sad.  Associated symptoms include does not have insomnia, no appetite change, no body aches, no myalgias, no headaches and no suicidal ideas.     The symptoms are aggravated by family issues.  Past treatments include SSRIs - Selective serotonin reuptake inhibitors.  Compliance with treatment is good.  Previous treatment provided mild relief.  Risk factors include a change in medication usage/dosage.   Past medical history includes anxiety and depression.     Pertinent negatives include no chronic fatigue syndrome and no eating disorder. Anxiety  Presents for follow-up visit. Symptoms include decreased concentration, depressed mood, excessive worry, nausea, nervous/anxious behavior and restlessness. Patient reports no chest pain, compulsions, confusion, dizziness, feeling of choking, insomnia, palpitations, shortness of breath or suicidal ideas. Symptoms occur occasionally. The severity of symptoms is moderate.   Her past medical history is significant for depression. Side effects of treatment include GI discomfort.    No problem-specific Assessment & Plan notes found for this encounter.   Past Medical History:  Diagnosis Date  . Anxiety   . Depression     Past Surgical History:  Procedure Laterality Date  . ABDOMINAL HYSTERECTOMY    . CHOLECYSTECTOMY    . VAGINAL  HYSTERECTOMY      Family History  Problem Relation Age of Onset  . Cancer Father   . Breast cancer Neg Hx     Social History   Social History  . Marital status: Unknown    Spouse name: N/A  . Number of children: N/A  . Years of education: N/A   Occupational History  . Not on file.   Social History Main Topics  . Smoking status: Never Smoker  . Smokeless tobacco: Never Used  . Alcohol use No  . Drug use: No  . Sexual activity: Not Currently   Other Topics Concern  . Not on file   Social History Narrative  . No narrative on file    No Known Allergies  Outpatient Medications Prior to Visit  Medication Sig Dispense Refill  . amitriptyline (ELAVIL) 50 MG tablet Take 1 tablet (50 mg total) by mouth at bedtime. 90 tablet 1  . Ascorbic Acid (VITAMIN C ADULT GUMMIES PO)     . aspirin 325 MG tablet Take 1 tablet by mouth as needed.    . calcium elemental as carbonate (BARIATRIC TUMS ULTRA) 400 MG tablet Chew 1 tablet by mouth daily at 12 noon.    . Cholecalciferol (VITAMIN D3 ADULT GUMMIES PO)     . FIBER COMPLETE PO Take 2 tablets by mouth daily at 12 noon.    Marland Kitchen lisinopril (PRINIVIL,ZESTRIL) 5 MG tablet Take 1 tablet (5 mg total) by mouth daily. 90 tablet 1  . sulfamethoxazole-trimethoprim (BACTRIM DS,SEPTRA DS) 800-160 MG tablet Take 1 tablet by mouth 2 (two) times daily. 10 tablet 0  No facility-administered medications prior to visit.     Review of Systems  Constitutional: Positive for fatigue. Negative for appetite change, chills, fever, malaise/fatigue and weight loss.  HENT: Negative for ear discharge, ear pain and sore throat.   Eyes: Negative for blurred vision.  Respiratory: Negative for cough, sputum production, shortness of breath and wheezing.   Cardiovascular: Negative for chest pain, palpitations and leg swelling.  Gastrointestinal: Positive for nausea. Negative for abdominal pain, blood in stool, constipation, diarrhea, heartburn and melena.   Genitourinary: Negative for dysuria, frequency, hematuria and urgency.  Musculoskeletal: Negative for back pain, joint pain, myalgias and neck pain.  Skin: Negative for rash.  Neurological: Negative for dizziness, tingling, sensory change, focal weakness and headaches.  Endo/Heme/Allergies: Negative for environmental allergies and polydipsia. Does not bruise/bleed easily.  Psychiatric/Behavioral: Positive for decreased concentration and depression. Negative for confusion and suicidal ideas. The patient is nervous/anxious. The patient does not have insomnia.      Objective  Vitals:   04/09/17 0908  BP: 100/62  Pulse: 80  Weight: 125 lb (56.7 kg)  Height: 4\' 11"  (1.499 m)    Physical Exam  Constitutional: She is well-developed, well-nourished, and in no distress. She is irritable. No distress.  HENT:  Head: Normocephalic and atraumatic.  Right Ear: External ear normal.  Left Ear: External ear normal.  Nose: Nose normal.  Mouth/Throat: Oropharynx is clear and moist.  Eyes: Pupils are equal, round, and reactive to light. Conjunctivae and EOM are normal. Right eye exhibits no discharge. Left eye exhibits no discharge.  Neck: Normal range of motion. Neck supple. No JVD present. No thyromegaly present.  Cardiovascular: Normal rate, regular rhythm, normal heart sounds and intact distal pulses.  Exam reveals no gallop and no friction rub.   No murmur heard. Pulmonary/Chest: Effort normal and breath sounds normal. She has no wheezes. She has no rales.  Abdominal: Soft. Bowel sounds are normal. She exhibits no mass. There is no tenderness. There is no guarding.  Musculoskeletal: Normal range of motion. She exhibits no edema.  Lymphadenopathy:    She has no cervical adenopathy.  Neurological: She is alert. She has normal reflexes.  Skin: Skin is warm and dry. She is not diaphoretic.  Psychiatric: Mood and affect normal.  Nursing note and vitals reviewed.     Assessment &  Plan  Problem List Items Addressed This Visit      Other   Anxiety and depression - Primary   Relevant Medications   citalopram (CELEXA) 20 MG tablet      Meds ordered this encounter  Medications  . citalopram (CELEXA) 20 MG tablet    Sig: Take 1 tablet (20 mg total) by mouth daily.    Dispense:  90 tablet    Refill:  0      Dr. Otilio Miu [facility]  04/09/17

## 2017-05-07 ENCOUNTER — Ambulatory Visit (INDEPENDENT_AMBULATORY_CARE_PROVIDER_SITE_OTHER): Payer: Medicare HMO | Admitting: Family Medicine

## 2017-05-07 ENCOUNTER — Encounter: Payer: Self-pay | Admitting: Family Medicine

## 2017-05-07 VITALS — BP 120/70 | HR 64 | Ht 59.0 in | Wt 126.0 lb

## 2017-05-07 DIAGNOSIS — J0100 Acute maxillary sinusitis, unspecified: Secondary | ICD-10-CM | POA: Diagnosis not present

## 2017-05-07 DIAGNOSIS — J40 Bronchitis, not specified as acute or chronic: Secondary | ICD-10-CM

## 2017-05-07 MED ORDER — GUAIFENESIN-CODEINE 100-10 MG/5ML PO SYRP: 5.0000 mL | ORAL_SOLUTION | Freq: Three times a day (TID) | ORAL | 0 refills | Status: DC | PRN

## 2017-05-07 MED ORDER — AMOXICILLIN 500 MG PO CAPS: 500.0000 mg | ORAL_CAPSULE | Freq: Three times a day (TID) | ORAL | 0 refills | Status: DC

## 2017-05-07 NOTE — Progress Notes (Signed)
Name: Michelle Willis   MRN: [id number]    DOB: 03-27-1951   Date:05/07/2017       Progress Note  Subjective  Chief Complaint  Chief Complaint  Patient presents with  . Sinusitis    cough without production- gets worse at night. some drainage    Sinusitis  This is a new problem. The current episode started in the past 7 days. The problem is unchanged. There has been no fever. The pain is mild. Associated symptoms include congestion, coughing, a hoarse voice, shortness of breath, sinus pressure and a sore throat. Pertinent negatives include no chills, diaphoresis, ear pain, headaches, neck pain, sneezing or swollen glands. Past treatments include nothing. The treatment provided no relief.  Cough  This is a new problem. The current episode started in the past 7 days. The problem has been unchanged. The cough is non-productive. Associated symptoms include rhinorrhea, a sore throat, shortness of breath and wheezing. Pertinent negatives include no chest pain, chills, ear pain, fever, headaches, heartburn, myalgias, rash or weight loss. The treatment provided mild relief. There is no history of environmental allergies.    No problem-specific Assessment & Plan notes found for this encounter.   Past Medical History:  Diagnosis Date  . Anxiety   . Depression     Past Surgical History:  Procedure Laterality Date  . ABDOMINAL HYSTERECTOMY    . CHOLECYSTECTOMY    . VAGINAL HYSTERECTOMY      Family History  Problem Relation Age of Onset  . Cancer Father   . Breast cancer Neg Hx     Social History   Social History  . Marital status: Unknown    Spouse name: N/A  . Number of children: N/A  . Years of education: N/A   Occupational History  . Not on file.   Social History Main Topics  . Smoking status: Never Smoker  . Smokeless tobacco: Never Used  . Alcohol use No  . Drug use: No  . Sexual activity: Not Currently   Other Topics Concern  . Not on file   Social History  Narrative  . No narrative on file    No Known Allergies  Outpatient Medications Prior to Visit  Medication Sig Dispense Refill  . amitriptyline (ELAVIL) 50 MG tablet Take 1 tablet (50 mg total) by mouth at bedtime. 90 tablet 1  . Ascorbic Acid (VITAMIN C ADULT GUMMIES PO)     . aspirin 325 MG tablet Take 1 tablet by mouth as needed.    . calcium elemental as carbonate (BARIATRIC TUMS ULTRA) 400 MG tablet Chew 1 tablet by mouth daily at 12 noon.    . Cholecalciferol (VITAMIN D3 ADULT GUMMIES PO)     . citalopram (CELEXA) 20 MG tablet Take 1 tablet (20 mg total) by mouth daily. 90 tablet 0  . FIBER COMPLETE PO Take 2 tablets by mouth daily at 12 noon.    Marland Kitchen lisinopril (PRINIVIL,ZESTRIL) 5 MG tablet Take 1 tablet (5 mg total) by mouth daily. 90 tablet 1   No facility-administered medications prior to visit.     Review of Systems  Constitutional: Negative for chills, diaphoresis, fever, malaise/fatigue and weight loss.  HENT: Positive for congestion, hoarse voice, rhinorrhea, sinus pressure and sore throat. Negative for ear discharge, ear pain and sneezing.   Eyes: Negative for blurred vision.  Respiratory: Positive for cough, shortness of breath and wheezing. Negative for sputum production.   Cardiovascular: Negative for chest pain, palpitations and leg swelling.  Gastrointestinal:  Negative for abdominal pain, blood in stool, constipation, diarrhea, heartburn, melena and nausea.  Genitourinary: Negative for dysuria, frequency, hematuria and urgency.  Musculoskeletal: Negative for back pain, joint pain, myalgias and neck pain.  Skin: Negative for rash.  Neurological: Negative for dizziness, tingling, sensory change, focal weakness and headaches.  Endo/Heme/Allergies: Negative for environmental allergies and polydipsia. Does not bruise/bleed easily.  Psychiatric/Behavioral: Negative for depression and suicidal ideas. The patient is not nervous/anxious and does not have insomnia.       Objective  Vitals:   05/07/17 1114  BP: 120/70  Pulse: 64  Weight: 126 lb (57.2 kg)  Height: 4\' 11"  (1.499 m)    Physical Exam  Constitutional: She is well-developed, well-nourished, and in no distress. No distress.  HENT:  Head: Normocephalic and atraumatic.  Right Ear: External ear normal.  Left Ear: External ear normal.  Nose: Right sinus exhibits maxillary sinus tenderness. Left sinus exhibits maxillary sinus tenderness.  Mouth/Throat: Oropharynx is clear and moist.  Eyes: Pupils are equal, round, and reactive to light. Conjunctivae and EOM are normal. Right eye exhibits no discharge. Left eye exhibits no discharge.  Neck: Normal range of motion. Neck supple. No JVD present. No thyromegaly present.  Cardiovascular: Normal rate, regular rhythm, normal heart sounds and intact distal pulses.  Exam reveals no gallop and no friction rub.   No murmur heard. Pulmonary/Chest: Effort normal and breath sounds normal. She has no wheezes. She has no rales.  Abdominal: Soft. Bowel sounds are normal. She exhibits no mass. There is no tenderness. There is no guarding.  Musculoskeletal: Normal range of motion. She exhibits no edema.  Lymphadenopathy:    She has no cervical adenopathy.  Neurological: She is alert. She has normal reflexes.  Skin: Skin is warm and dry. She is not diaphoretic.  Psychiatric: Mood and affect normal.  Nursing note and vitals reviewed.     Assessment & Plan  Problem List Items Addressed This Visit    None    Visit Diagnoses    Acute non-recurrent maxillary sinusitis    -  Primary   Relevant Medications   amoxicillin (AMOXIL) 500 MG capsule   guaiFENesin-codeine (ROBITUSSIN AC) 100-10 MG/5ML syrup   Bronchitis          Meds ordered this encounter  Medications  . amoxicillin (AMOXIL) 500 MG capsule    Sig: Take 1 capsule (500 mg total) by mouth 3 (three) times daily.    Dispense:  30 capsule    Refill:  0  . guaiFENesin-codeine (ROBITUSSIN  AC) 100-10 MG/5ML syrup    Sig: Take 5 mLs by mouth 3 (three) times daily as needed for cough.    Dispense:  100 mL    Refill:  0      Dr. Otilio Miu [facility]  05/07/17

## 2017-05-21 ENCOUNTER — Ambulatory Visit: Payer: Self-pay | Admitting: Family Medicine

## 2017-05-30 ENCOUNTER — Ambulatory Visit: Payer: Self-pay | Admitting: Family Medicine

## 2017-08-13 ENCOUNTER — Telehealth: Payer: Self-pay | Admitting: Family Medicine

## 2017-08-13 NOTE — Telephone Encounter (Signed)
Called pt to sched for AWV with Nurse Health Advisor.  C/b #  [phone] on Skype [email or web address] if you have questions ° °

## 2017-08-28 ENCOUNTER — Ambulatory Visit (INDEPENDENT_AMBULATORY_CARE_PROVIDER_SITE_OTHER): Payer: Medicare HMO | Admitting: Family Medicine

## 2017-08-28 ENCOUNTER — Encounter: Payer: Self-pay | Admitting: Family Medicine

## 2017-08-28 VITALS — BP 124/70 | HR 80 | Ht 59.0 in | Wt 127.0 lb

## 2017-08-28 DIAGNOSIS — F329 Major depressive disorder, single episode, unspecified: Secondary | ICD-10-CM

## 2017-08-28 DIAGNOSIS — Z1231 Encounter for screening mammogram for malignant neoplasm of breast: Secondary | ICD-10-CM | POA: Diagnosis not present

## 2017-08-28 DIAGNOSIS — F419 Anxiety disorder, unspecified: Secondary | ICD-10-CM

## 2017-08-28 DIAGNOSIS — F331 Major depressive disorder, recurrent, moderate: Secondary | ICD-10-CM | POA: Diagnosis not present

## 2017-08-28 DIAGNOSIS — I10 Essential (primary) hypertension: Secondary | ICD-10-CM

## 2017-08-28 DIAGNOSIS — Z1239 Encounter for other screening for malignant neoplasm of breast: Secondary | ICD-10-CM

## 2017-08-28 MED ORDER — CITALOPRAM HYDROBROMIDE 20 MG PO TABS: 20.0000 mg | ORAL_TABLET | Freq: Every day | ORAL | 0 refills | Status: DC

## 2017-08-28 MED ORDER — AMITRIPTYLINE HCL 50 MG PO TABS: 50.0000 mg | ORAL_TABLET | Freq: Every day | ORAL | 1 refills | Status: DC

## 2017-08-28 NOTE — Progress Notes (Signed)
Name: Michelle Willis   MRN: [id number]    DOB: [birth year]   Date:08/28/2017       Progress Note  Subjective  Chief Complaint  Chief Complaint  Patient presents with  . Hypertension    stopped Lisinopril due to dizziness and "feeling sick to my stomach"- stopped med in November  . Depression    Hypertension  This is a chronic problem. The current episode started more than 1 year ago. The problem has been gradually improving since onset. The problem is controlled. Pertinent negatives include no anxiety, blurred vision, chest pain, headaches, malaise/fatigue, neck pain, orthopnea, palpitations, peripheral edema, PND, shortness of breath or sweats. There are no associated agents to hypertension. Past treatments include lifestyle changes. The current treatment provides moderate improvement. There are no compliance problems.  There is no history of angina, kidney disease, CAD/MI, CVA, heart failure, left ventricular hypertrophy, PVD or retinopathy. There is no history of chronic renal disease, a hypertension causing med or renovascular disease.  Depression       The patient presents with depression.  This is a chronic problem.  The current episode started more than 1 year ago.   The onset quality is gradual.   The problem has been gradually improving since onset.  Associated symptoms include no helplessness, no hopelessness, does not have insomnia, not irritable, no decreased interest, no myalgias, no headaches, not sad and no suicidal ideas.( All improved)  Past treatments include SSRIs - Selective serotonin reuptake inhibitors and TCAs - Tricyclic antidepressants.  Compliance with treatment is good.  Previous treatment provided significant relief.  Past medical history includes depression.     Pertinent negatives include no chronic fatigue syndrome, no hypothyroidism and no anxiety. Insomnia  Primary symptoms: fragmented sleep, no malaise/fatigue.  The problem has been gradually improving since onset.  PMH includes: depression.    No problem-specific Assessment & Plan notes found for this encounter.   Past Medical History:  Diagnosis Date  . Anxiety   . Depression     Past Surgical History:  Procedure Laterality Date  . ABDOMINAL HYSTERECTOMY    . CHOLECYSTECTOMY    . VAGINAL HYSTERECTOMY      Family History  Problem Relation Age of Onset  . Cancer Father   . Breast cancer Neg Hx     Social History   Socioeconomic History  . Marital status: Unknown    Spouse name: Not on file  . Number of children: Not on file  . Years of education: Not on file  . Highest education level: Not on file  Social Needs  . Financial resource strain: Not on file  . Food insecurity - worry: Not on file  . Food insecurity - inability: Not on file  . Transportation needs - medical: Not on file  . Transportation needs - non-medical: Not on file  Occupational History  . Not on file  Tobacco Use  . Smoking status: Never Smoker  . Smokeless tobacco: Never Used  Substance and Sexual Activity  . Alcohol use: No    Alcohol/week: 0.0 oz  . Drug use: No  . Sexual activity: Not Currently  Other Topics Concern  . Not on file  Social History Narrative  . Not on file    No Known Allergies  Outpatient Medications Prior to Visit  Medication Sig Dispense Refill  . Ascorbic Acid (VITAMIN C ADULT GUMMIES PO)     . aspirin 325 MG tablet Take 1 tablet by mouth as needed.    Marland Kitchen  calcium elemental as carbonate (BARIATRIC TUMS ULTRA) 400 MG tablet Chew 1 tablet by mouth daily at 12 noon.    . Cholecalciferol (VITAMIN D3 ADULT GUMMIES PO)     . FIBER COMPLETE PO Take 2 tablets by mouth daily at 12 noon.    Marland Kitchen amitriptyline (ELAVIL) 50 MG tablet Take 1 tablet (50 mg total) by mouth at bedtime. 90 tablet 1  . citalopram (CELEXA) 20 MG tablet Take 1 tablet (20 mg total) by mouth daily. 90 tablet 0  . amoxicillin (AMOXIL) 500 MG capsule Take 1 capsule (500 mg total) by mouth 3 (three) times daily. 30  capsule 0  . guaiFENesin-codeine (ROBITUSSIN AC) 100-10 MG/5ML syrup Take 5 mLs by mouth 3 (three) times daily as needed for cough. 100 mL 0  . lisinopril (PRINIVIL,ZESTRIL) 5 MG tablet Take 1 tablet (5 mg total) by mouth daily. (Patient not taking: Reported on 08/28/2017) 90 tablet 1   No facility-administered medications prior to visit.     Review of Systems  Constitutional: Negative for chills, fever, malaise/fatigue and weight loss.  HENT: Negative for ear discharge, ear pain and sore throat.   Eyes: Negative for blurred vision.  Respiratory: Negative for cough, sputum production, shortness of breath and wheezing.   Cardiovascular: Negative for chest pain, palpitations, orthopnea, leg swelling and PND.  Gastrointestinal: Negative for abdominal pain, blood in stool, constipation, diarrhea, heartburn, melena and nausea.  Genitourinary: Negative for dysuria, frequency, hematuria and urgency.  Musculoskeletal: Negative for back pain, joint pain, myalgias and neck pain.  Skin: Negative for rash.  Neurological: Negative for dizziness, tingling, sensory change, focal weakness and headaches.  Endo/Heme/Allergies: Negative for environmental allergies and polydipsia. Does not bruise/bleed easily.  Psychiatric/Behavioral: Positive for depression. Negative for suicidal ideas. The patient is not nervous/anxious and does not have insomnia.      Objective  Vitals:   08/28/17 1024  BP: 124/70  Pulse: 80  Weight: 127 lb (57.6 kg)  Height: 4\' 11"  (1.499 m)    Physical Exam  Constitutional: She is well-developed, well-nourished, and in no distress. She is not irritable. No distress.  HENT:  Head: Normocephalic and atraumatic.  Right Ear: External ear normal.  Left Ear: External ear normal.  Nose: Nose normal.  Mouth/Throat: Oropharynx is clear and moist.  Eyes: Conjunctivae and EOM are normal. Pupils are equal, round, and reactive to light. Right eye exhibits no discharge. Left eye exhibits  no discharge.  Neck: Normal range of motion. Neck supple. No JVD present. No thyromegaly present.  Cardiovascular: Normal rate, regular rhythm, normal heart sounds and intact distal pulses. Exam reveals no gallop and no friction rub.  No murmur heard. Pulmonary/Chest: Effort normal and breath sounds normal. She has no wheezes. She has no rales.  Abdominal: Soft. Bowel sounds are normal. She exhibits no mass. There is no tenderness. There is no guarding.  Musculoskeletal: Normal range of motion. She exhibits no edema.  Lymphadenopathy:    She has no cervical adenopathy.  Neurological: She is alert.  Skin: Skin is warm and dry. She is not diaphoretic.  Psychiatric: Mood and affect normal.  Nursing note and vitals reviewed.     Assessment & Plan  Problem List Items Addressed This Visit      Cardiovascular and Mediastinum   Essential hypertension   Relevant Orders   Renal Function Panel     Other   Anxiety and depression   Relevant Medications   citalopram (CELEXA) 20 MG tablet   amitriptyline (ELAVIL) 50  MG tablet    Other Visit Diagnoses    Major depressive disorder, recurrent episode, moderate ([place])    -  Primary   patient self stopped celexa and buspar   Relevant Medications   citalopram (CELEXA) 20 MG tablet   amitriptyline (ELAVIL) 50 MG tablet   Breast cancer screening       Relevant Orders   MM Digital Screening      Meds ordered this encounter  Medications  . citalopram (CELEXA) 20 MG tablet    Sig: Take 1 tablet (20 mg total) by mouth daily.    Dispense:  90 tablet    Refill:  0  . amitriptyline (ELAVIL) 50 MG tablet    Sig: Take 1 tablet (50 mg total) by mouth at bedtime.    Dispense:  90 tablet    Refill:  1      Dr. Otilio [facility]  08/28/17

## 2017-08-29 ENCOUNTER — Telehealth: Payer: Self-pay | Admitting: Family Medicine

## 2017-08-29 LAB — RENAL FUNCTION PANEL
ALBUMIN: 4 g/dL (ref 3.6–4.8)
BUN/Creatinine Ratio: 19 (ref 12–28)
BUN: 16 mg/dL (ref 8–27)
CALCIUM: 9.4 mg/dL (ref 8.7–10.3)
CHLORIDE: 105 mmol/L (ref 96–106)
CO2: 24 mmol/L (ref 20–29)
Creatinine, Ser: 0.83 mg/dL (ref 0.57–1.00)
GFR calc non Af Amer: 73 mL/min/{1.73_m2} (ref >59)
GFR, EST AFRICAN AMERICAN: 84 mL/min/{1.73_m2} (ref >59)
GLUCOSE: 80 mg/dL (ref 65–99)
PHOSPHORUS: 3.2 mg/dL (ref 2.5–4.5)
Potassium: 4.8 mmol/L (ref 3.5–5.2)
Sodium: 142 mmol/L (ref 134–144)

## 2017-08-29 NOTE — Telephone Encounter (Signed)
Called pt to sched for AWV with Nurse Health Advisor.  C/b #  [phone] on Skype [email or web address] if you have questions ° °

## 2017-09-03 ENCOUNTER — Ambulatory Visit (INDEPENDENT_AMBULATORY_CARE_PROVIDER_SITE_OTHER): Payer: Medicare HMO

## 2017-09-03 VITALS — BP 138/70 | HR 78 | Temp 97.5°F | Resp 12 | Ht 59.0 in | Wt 130.6 lb

## 2017-09-03 DIAGNOSIS — Z13820 Encounter for screening for osteoporosis: Secondary | ICD-10-CM

## 2017-09-03 DIAGNOSIS — Z0000 Encounter for general adult medical examination without abnormal findings: Secondary | ICD-10-CM | POA: Diagnosis not present

## 2017-09-03 DIAGNOSIS — Z114 Encounter for screening for human immunodeficiency virus [HIV]: Secondary | ICD-10-CM | POA: Diagnosis not present

## 2017-09-03 DIAGNOSIS — E2839 Other primary ovarian failure: Secondary | ICD-10-CM | POA: Diagnosis not present

## 2017-09-03 DIAGNOSIS — E559 Vitamin D deficiency, unspecified: Secondary | ICD-10-CM

## 2017-09-03 DIAGNOSIS — Z1159 Encounter for screening for other viral diseases: Secondary | ICD-10-CM

## 2017-09-03 IMAGING — MG MM DIGITAL SCREENING BILAT W/ CAD
4 series · 4 of 4 positions shown · non-contrast
Comparison: None.

CLINICAL DATA: Screening.

EXAM:
DIGITAL SCREENING BILATERAL MAMMOGRAM WITH CAD

[L CC]
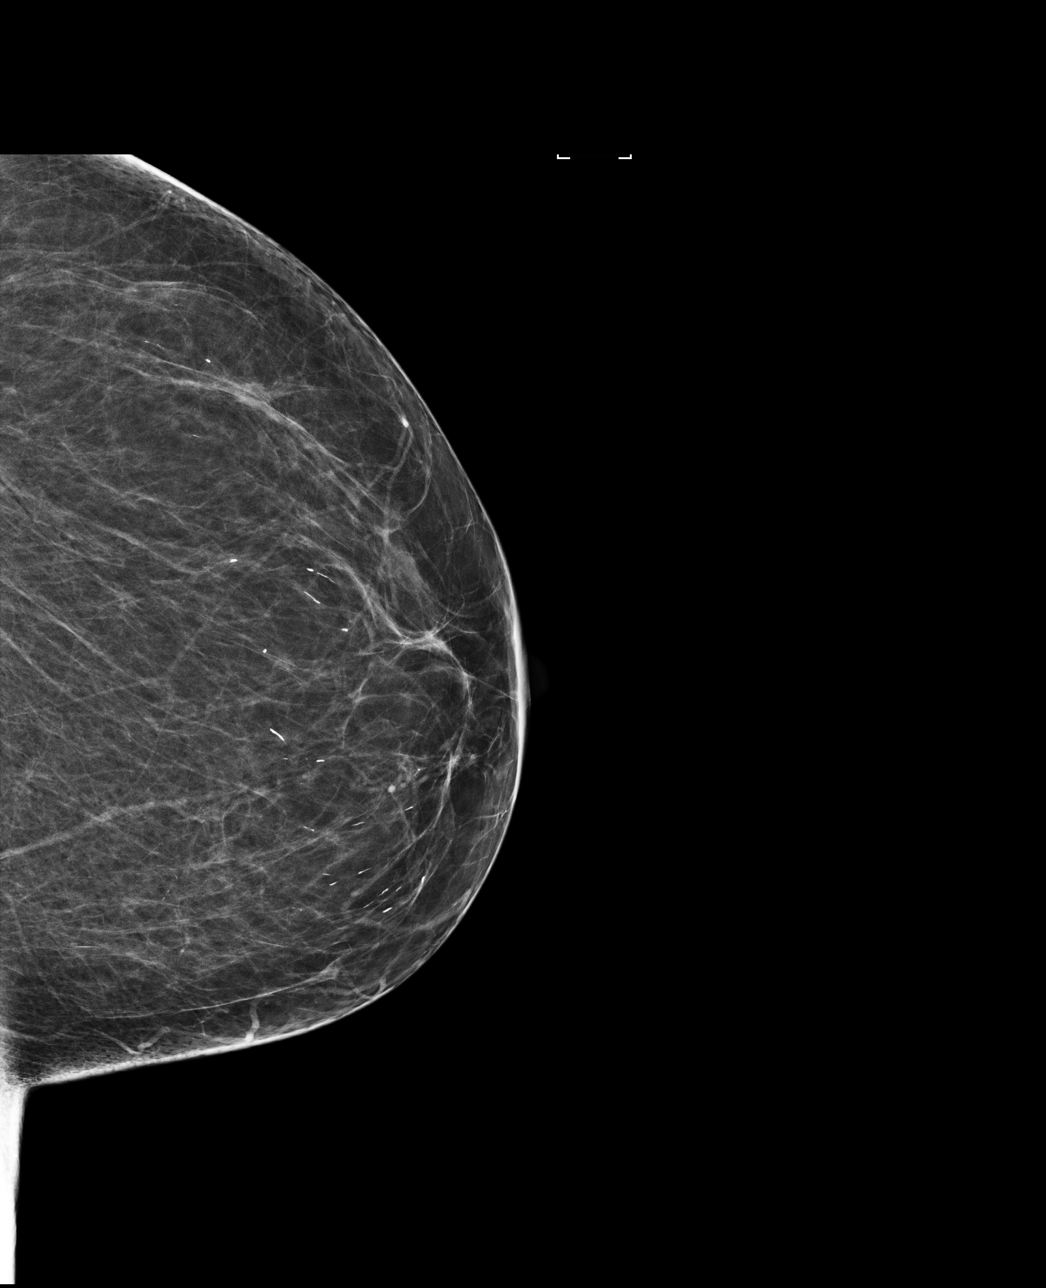

[R CC]
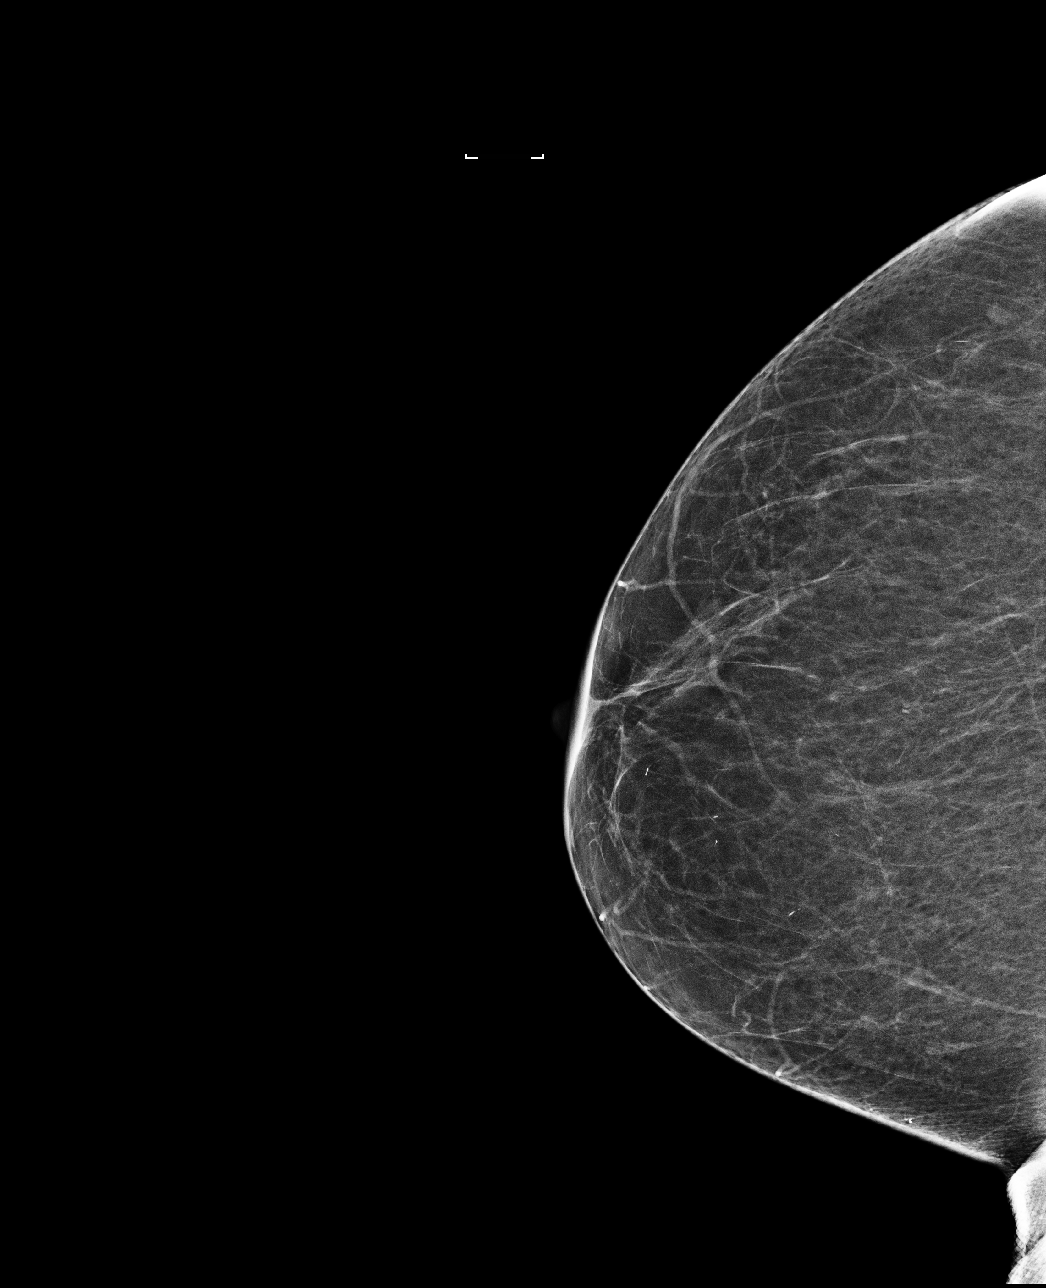

[R MLO]
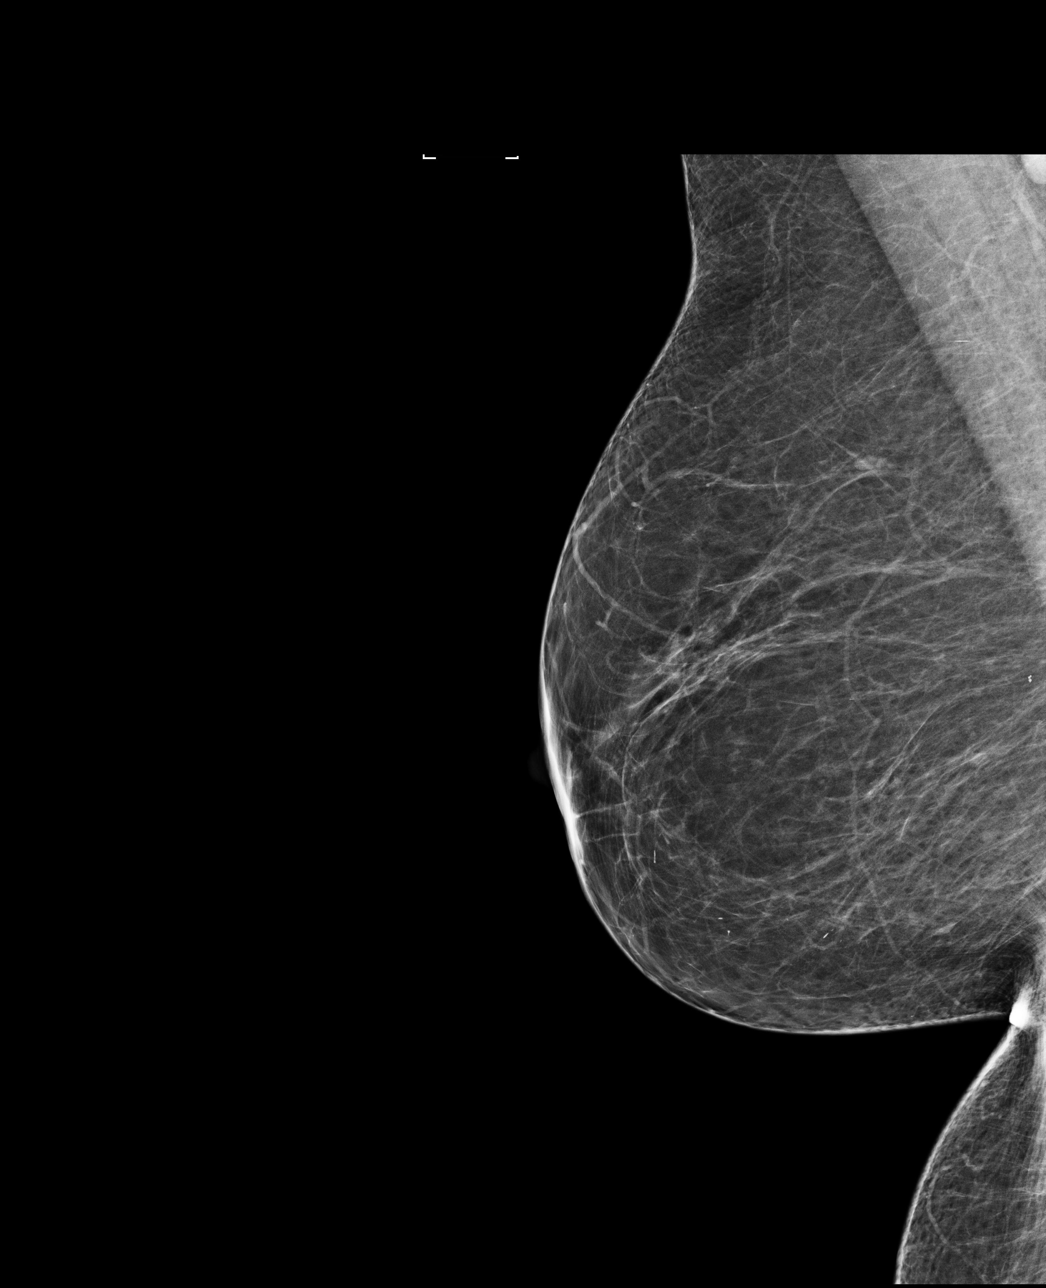

[L MLO]
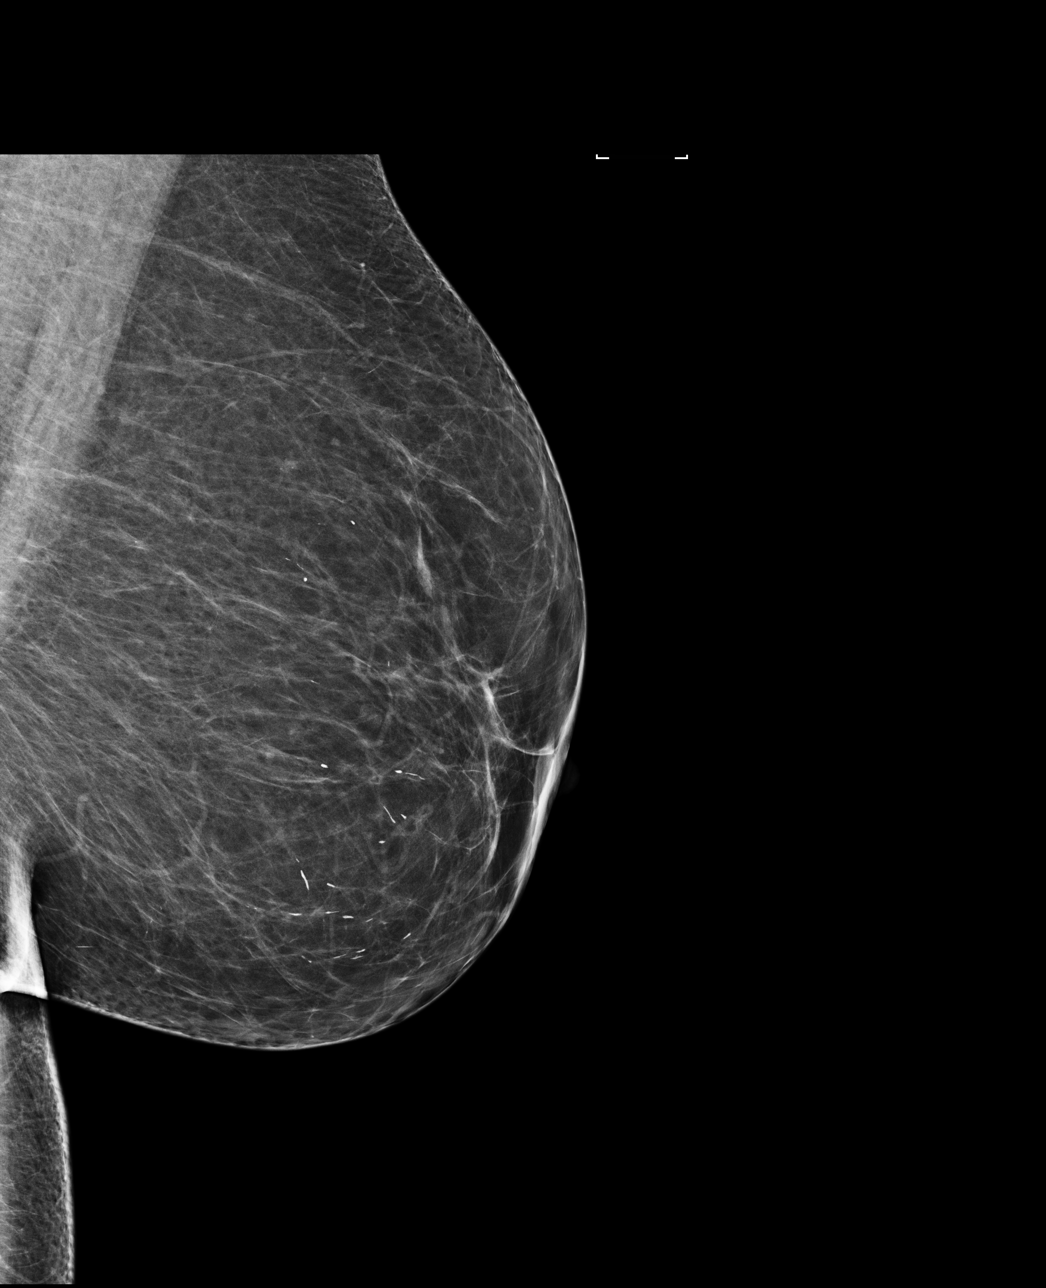

[4 of 4 positions shown; findings below may reference images not displayed]

ACR Breast Density Category b: There are scattered areas of
fibroglandular density.
FINDINGS: There are no findings suspicious for malignancy. Images were
processed with CAD.
IMPRESSION: No mammographic evidence of malignancy. A result letter of this
screening mammogram will be mailed directly to the patient.

RECOMMENDATION:
Screening mammogram in one year. (Code:[id number]-[id number])

BI-RADS CATEGORY  1: Negative.

## 2017-09-03 NOTE — Progress Notes (Signed)
Subjective:   Michelle Willis is a 67 y.o. female who presents for Medicare Annual (Subsequent) preventive examination.  Review of Systems:  N/A Cardiac Risk Factors include: advanced age (>5men, >73 women);hypertension     Objective:     Vitals: BP 138/70 (BP Location: Right Arm, Patient Position: Sitting, Cuff Size: Normal)   Pulse 78   Temp (!) 97.5 F (36.4 C) (Oral)   Resp 12   Ht 4\' 11"  (1.499 m)   Wt 130 lb 9.6 oz (59.2 kg)   BMI 26.38 kg/m   Body mass index is 26.38 kg/m.  Advanced Directives 09/03/2017 03/28/2015  Does Patient Have a Medical Advance Directive? Yes Yes  Type of Paramedic of [place];Living will Living will  Copy of [place] in Chart? No - copy requested No - copy requested    Tobacco Social History   Tobacco Use  Smoking Status Never Smoker  Smokeless Tobacco Never Used     Counseling given: Not Answered   Clinical Intake:  Pre-visit preparation completed: Yes  Pain : No/denies pain  BMI - recorded: 26.38 Nutritional Status: BMI 25 -29 Overweight Nutritional Risks: None Diabetes: No  How often do you need to have someone help you when you read instructions, pamphlets, or other written materials from your doctor or pharmacy?: 1 - Never  Interpreter Needed?: No  Information entered by :: AEversole, LPN  Past Medical History:  Diagnosis Date  . Anxiety   . Depression    Past Surgical History:  Procedure Laterality Date  . ABDOMINAL HYSTERECTOMY    . CHOLECYSTECTOMY    . VAGINAL HYSTERECTOMY     Family History  Problem Relation Age of Onset  . Cancer Father   . Breast cancer Neg Hx    Social History   Socioeconomic History  . Marital status: Divorced    Spouse name: None  . Number of children: 3  . Years of education: some college  . Highest education level: Some college, no degree  Social Needs  . Financial resource strain: Not hard at all  . Food insecurity - worry:  Never true  . Food insecurity - inability: Never true  . Transportation needs - medical: No  . Transportation needs - non-medical: No  Occupational History  . Occupation: Retired  Tobacco Use  . Smoking status: Never Smoker  . Smokeless tobacco: Never Used  Substance and Sexual Activity  . Alcohol use: No    Alcohol/week: 0.0 oz  . Drug use: No  . Sexual activity: Not Currently  Other Topics Concern  . None  Social History Narrative  . None    Outpatient Encounter Medications as of 09/03/2017  Medication Sig  . amitriptyline (ELAVIL) 50 MG tablet Take 1 tablet (50 mg total) by mouth at bedtime.  . Ascorbic Acid (VITAMIN C ADULT GUMMIES PO)   . aspirin 325 MG tablet Take 1 tablet by mouth as needed.  . calcium elemental as carbonate (BARIATRIC TUMS ULTRA) 400 MG tablet Chew 1 tablet by mouth daily as needed.   . Cholecalciferol (VITAMIN D3 ADULT GUMMIES PO) Take 1 tablet by mouth daily.   . citalopram (CELEXA) 20 MG tablet Take 1 tablet (20 mg total) by mouth daily.  Marland Kitchen FIBER COMPLETE PO Take 2 tablets by mouth daily at 12 noon.   No facility-administered encounter medications on file as of 09/03/2017.     Activities of Daily Living In your present state of health, do you have  any difficulty performing the following activities: 09/03/2017  Hearing? N  Comment denies wearing hearing aids  Vision? N  Comment denies wearing eyeglasses  Difficulty concentrating or making decisions? N  Walking or climbing stairs? N  Dressing or bathing? N  Doing errands, shopping? N  Preparing Food and eating ? N  Comment denies wearing dentures  Using the Toilet? N  In the past six months, have you accidently leaked urine? N  Do you have problems with loss of bowel control? N  Managing your Medications? N  Managing your Finances? N  Housekeeping or managing your Housekeeping? N  Some recent data might be hidden    Patient Care Team: Juline Patch, MD as PCP - General (Family Medicine)      Assessment:   This is a routine wellness examination for Michelle Willis.  Exercise Activities and Dietary recommendations Current Exercise Habits: Structured exercise class, Type of exercise: Other - see comments(swimming), Time (Minutes): 60, Frequency (Times/Week): 5, Weekly Exercise (Minutes/Week): 300, Intensity: Mild, Exercise limited by: None identified  Goals    . DIET - INCREASE WATER INTAKE     Recommend to drink at least 6-8 8oz glasses of water per day       Fall Risk Fall Risk  09/03/2017 08/28/2017 06/25/2016  Falls in the past year? No No No   Is the patient's home free of loose throw rugs in walkways, pet beds, electrical cords, etc?   yes      Grab bars in the bathroom? No Denies use of a shower chair      Handrails on the stairs?   No. Denies any stairs in or around home      Adequate lighting?   yes  Denies use of a walker, cane or w/c. Also denies use of an elevated toilet seat  Depression Screen PHQ 2/9 Scores 09/03/2017 08/28/2017 08/28/2017 04/09/2017  PHQ - 2 Score 0 0 0 3  PHQ- 9 Score 0 0 - 5     Cognitive Function     6CIT Screen 09/03/2017  What Year? 0 points  What month? 0 points  What time? 0 points  Count back from 20 0 points  Months in reverse 0 points  Repeat phrase 0 points  Total Score 0     There is no immunization history on file for this patient.  Qualifies for Shingles Vaccine? Yes. Due for Zostavax or Shingrix vaccine. Education has been provided regarding the importance of this vaccine. Pt has been advised to call her insurance company to determine her out of pocket expense. Advised she may also receive this vaccine at her local pharmacy or Health Dept. Verbalized acceptance and understanding.  Due for Tdap vaccine. Declined my offer to administer today. Education has been provided regarding the importance of this vaccine but still declined. Pt has been advised to call her insurance company to determine her out of pocket expense. Advised she may  also receive this vaccine at her local pharmacy or Health Dept. Verbalized acceptance and understanding.   Due for Flu vaccine. Declined my offer to administer today. Education has been provided regarding the importance of this vaccine but still declined. Pt has been advised to call her insurance company to determine her out of pocket expense. Advised she may also receive this vaccine at her local pharmacy or Health Dept. Verbalized acceptance and understanding.   Due for Pneumococcal vaccine. Declined my offer to administer today. Education has been provided regarding the importance of this vaccine  but still declined. Pt has been advised to call her insurance company to determine her out of pocket expense. Advised she may also receive this vaccine at her local pharmacy or Health Dept. Verbalized acceptance and understanding.   Screening Tests Health Maintenance  Topic Date Due  . Hepatitis C Screening  May 04, 1951  . COLONOSCOPY  12/14/1998  . DEXA SCAN  12/13/2013  . MAMMOGRAM  07/08/2017  . INFLUENZA VACCINE  08/28/2018 (Originally 03/26/2017)  . TETANUS/TDAP  09/03/2018 (Originally 12/14/1967)  . PNA vac Low Risk Adult (1 of 2 - PCV13) 09/03/2018 (Originally 12/13/2013)    Cancer Screenings: Breast:  Up to date on Mammogram? No  Completed 07/08/16. Pt is scheduled to complete exam on 09/16/17 at [facility] Up to date of Bone Density/Dexa? No Ordered today. Message sent to referral coordinator for scheduling purposes. Colorectal: Declined my offer to order colonoscopy, FOBT or Cologuard. States her insurance company is sending her "cards" in the mail for her to check her stool. States [employer] company will send results to Dr. Ronnald Ramp  Additional Screenings: HIV Screening: Ordered today. Lab req given to pt for completion Hepatitis C Screening: Ordered today. Lab req given to pt for completion     Plan:   I have personally reviewed and addressed the Medicare Annual Wellness  questionnaire and have noted the following in the patient's chart:  A. Medical and social history B. Use of alcohol, tobacco or illicit drugs  C. Current medications and supplements D. Functional ability and status E.  Nutritional status F.  Physical activity G. Advance directives H. List of other physicians I.  Hospitalizations, surgeries, and ER visits in previous 12 months J.  [place] such as hearing and vision if needed, cognitive and depression L. Referrals and appointments - none  In addition, I have reviewed and discussed with patient certain preventive protocols, quality metrics, and best practice recommendations. A written personalized care plan for preventive services as well as general preventive health recommendations were provided to patient.  Signed,  Aleatha Borer, LPN Nurse Health Advisor  MD Recommendations: Due for Hep C and HIV Screening. Ordered today. Lab req provided to pt for completion.  Due for Tdap, Flu, Pneumococcal and Shingles vaccines. Declined my offer to administer Tdap, Flu and Pneumococcal vaccines. Education has been provided regarding the importance of these vaccines. Pt has been advised to call our office if she should change her mind. Education has also been provided regarding the importance of the Shingrix vaccine. Pt has been advised to call her insurance company to determine her out of pocket expense. Advised she may also receive this vaccine at her local pharmacy or Health Dept. Verbalized acceptance and understanding.  Due for DEXA. Ordered today. Message sent to referral coordinator for scheduling purposes.  Due for mammogram. Scheduled for completion on 09/16/17  Due for colorectal screening. Declined my offer to refer to GI for screening colonoscopy. Also declined my offer to order FOBT or Cologuard. States [facility] is sending stool cards in the mail. States results of the stool cards will be forwarded to Dr. Ronnald Ramp. Will await these  results.

## 2017-09-03 NOTE — Patient Instructions (Signed)
Michelle Willis , Thank you for taking time to come for your Medicare Wellness Visit. I appreciate your ongoing commitment to your health goals. Please review the following plan we discussed and let me know if I can assist you in the future.   Screening recommendations/referrals: Colonoscopy: Declined Mammogram: Scheduled on 09/16/17 Bone Density: Ordered today Recommended yearly ophthalmology/optometry visit for glaucoma screening and checkup Recommended yearly dental visit for hygiene and checkup  Vaccinations: Influenza vaccine: Declined Pneumococcal vaccine: Declined Tdap vaccine: Declined. Please call your insurance company to determine your out of pocket expense. You may also receive this vaccine at your local pharmacy or Health Dept. Shingles vaccine: Declined. Please call your insurance company to determine your out of pocket expense. You may also receive this vaccine at your local pharmacy or Health Dept.  Advanced directives: Please bring a copy of your POA (Power of Attorney) and/or Living Will to your next appointment.   Conditions/risks identified: Recommend to drink at least 6-8 8oz glasses of water per day  Next appointment: You are scheduled to see Dr. Ronnald Ramp on 09/15/17 @ 9:00am.   Please schedule your Annual Wellness Visit with your Nurse Health Advisor in one year.  Preventive Care 44 Years and Older, Female Preventive care refers to lifestyle choices and visits with your health care provider that can promote health and wellness. What does preventive care include?  A yearly physical exam. This is also called an annual well check.  Dental exams once or twice a year.  Routine eye exams. Ask your health care provider how often you should have your eyes checked.  Personal lifestyle choices, including:  Daily care of your teeth and gums.  Regular physical activity.  Eating a healthy diet.  Avoiding tobacco and drug use.  Limiting alcohol use.  Practicing safe  sex.  Taking low-dose aspirin every day.  Taking vitamin and mineral supplements as recommended by your health care provider. What happens during an annual well check? The services and screenings done by your health care provider during your annual well check will depend on your age, overall health, lifestyle risk factors, and family history of disease. Counseling  Your health care provider may ask you questions about your:  Alcohol use.  Tobacco use.  Drug use.  Emotional well-being.  Home and relationship well-being.  Sexual activity.  Eating habits.  History of falls.  Memory and ability to understand (cognition).  Work and work Statistician.  Reproductive health. Screening  You may have the following tests or measurements:  Height, weight, and BMI.  Blood pressure.  Lipid and cholesterol levels. These may be checked every 5 years, or more frequently if you are over 36 years old.  Skin check.  Lung cancer screening. You may have this screening every year starting at age 2 if you have a 30-pack-year history of smoking and currently smoke or have quit within the past 15 years.  Fecal occult blood test (FOBT) of the stool. You may have this test every year starting at age 17.  Flexible sigmoidoscopy or colonoscopy. You may have a sigmoidoscopy every 5 years or a colonoscopy every 10 years starting at age 44.  Hepatitis C blood test.  Hepatitis B blood test.  Sexually transmitted disease (STD) testing.  Diabetes screening. This is done by checking your blood sugar (glucose) after you have not eaten for a while (fasting). You may have this done every 1-3 years.  Bone density scan. This is done to screen for osteoporosis. You may have  this done starting at age 51.  Mammogram. This may be done every 1-2 years. Talk to your health care provider about how often you should have regular mammograms. Talk with your health care provider about your test results,  treatment options, and if necessary, the need for more tests. Vaccines  Your health care provider may recommend certain vaccines, such as:  Influenza vaccine. This is recommended every year.  Tetanus, diphtheria, and acellular pertussis (Tdap, Td) vaccine. You may need a Td booster every 10 years.  Zoster vaccine. You may need this after age 21.  Pneumococcal 13-valent conjugate (PCV13) vaccine. One dose is recommended after age 39.  Pneumococcal polysaccharide (PPSV23) vaccine. One dose is recommended after age 48. Talk to your health care provider about which screenings and vaccines you need and how often you need them. This information is not intended to replace advice given to you by your health care provider. Make sure you discuss any questions you have with your health care provider. Document Released: 09/08/2015 Document Revised: 05/01/2016 Document Reviewed: 06/13/2015 Elsevier Interactive Patient Education  2017 [place] Prevention in the Home Falls can cause injuries. They can happen to people of all ages. There are many things you can do to make your home safe and to help prevent falls. What can I do on the outside of my home?  Regularly fix the edges of walkways and driveways and fix any cracks.  Remove anything that might make you trip as you walk through a door, such as a raised step or threshold.  Trim any bushes or trees on the path to your home.  Use bright outdoor lighting.  Clear any walking paths of anything that might make someone trip, such as rocks or tools.  Regularly check to see if handrails are loose or broken. Make sure that both sides of any steps have handrails.  Any raised decks and porches should have guardrails on the edges.  Have any leaves, snow, or ice cleared regularly.  Use sand or salt on walking paths during winter.  Clean up any spills in your garage right away. This includes oil or grease spills. What can I do in the  bathroom?  Use night lights.  Install grab bars by the toilet and in the tub and shower. Do not use towel bars as grab bars.  Use non-skid mats or decals in the tub or shower.  If you need to sit down in the shower, use a plastic, non-slip stool.  Keep the floor dry. Clean up any water that spills on the floor as soon as it happens.  Remove soap buildup in the tub or shower regularly.  Attach bath mats securely with double-sided non-slip rug tape.  Do not have throw rugs and other things on the floor that can make you trip. What can I do in the bedroom?  Use night lights.  Make sure that you have a light by your bed that is easy to reach.  Do not use any sheets or blankets that are too big for your bed. They should not hang down onto the floor.  Have a firm chair that has side arms. You can use this for support while you get dressed.  Do not have throw rugs and other things on the floor that can make you trip. What can I do in the kitchen?  Clean up any spills right away.  Avoid walking on wet floors.  Keep items that you use a lot in easy-to-reach  places.  If you need to reach something above you, use a strong step stool that has a grab bar.  Keep electrical cords out of the way.  Do not use floor polish or wax that makes floors slippery. If you must use wax, use non-skid floor wax.  Do not have throw rugs and other things on the floor that can make you trip. What can I do with my stairs?  Do not leave any items on the stairs.  Make sure that there are handrails on both sides of the stairs and use them. Fix handrails that are broken or loose. Make sure that handrails are as long as the stairways.  Check any carpeting to make sure that it is firmly attached to the stairs. Fix any carpet that is loose or worn.  Avoid having throw rugs at the top or bottom of the stairs. If you do have throw rugs, attach them to the floor with carpet tape.  Make sure that you have a  light switch at the top of the stairs and the bottom of the stairs. If you do not have them, ask someone to add them for you. What else can I do to help prevent falls?  Wear shoes that:  Do not have high heels.  Have rubber bottoms.  Are comfortable and fit you well.  Are closed at the toe. Do not wear sandals.  If you use a stepladder:  Make sure that it is fully opened. Do not climb a closed stepladder.  Make sure that both sides of the stepladder are locked into place.  Ask someone to hold it for you, if possible.  Clearly mark and make sure that you can see:  Any grab bars or handrails.  First and last steps.  Where the edge of each step is.  Use tools that help you move around (mobility aids) if they are needed. These include:  Canes.  Walkers.  Scooters.  Crutches.  Turn on the lights when you go into a dark area. Replace any light bulbs as soon as they burn out.  Set up your furniture so you have a clear path. Avoid moving your furniture around.  If any of your floors are uneven, fix them.  If there are any pets around you, be aware of where they are.  Review your medicines with your doctor. Some medicines can make you feel dizzy. This can increase your chance of falling. Ask your doctor what other things that you can do to help prevent falls. This information is not intended to replace advice given to you by your health care provider. Make sure you discuss any questions you have with your health care provider. Document Released: 06/08/2009 Document Revised: 01/18/2016 Document Reviewed: 09/16/2014 Elsevier Interactive Patient Education  2017 [employer].

## 2017-09-08 ENCOUNTER — Other Ambulatory Visit: Payer: Self-pay

## 2017-09-15 ENCOUNTER — Ambulatory Visit: Payer: Self-pay | Admitting: Family Medicine

## 2017-09-16 ENCOUNTER — Ambulatory Visit: Payer: Self-pay

## 2017-09-16 ENCOUNTER — Other Ambulatory Visit: Payer: Self-pay

## 2017-09-22 ENCOUNTER — Inpatient Hospital Stay: Admission: RE | Admit: 2017-09-22 | Payer: Self-pay | Source: Ambulatory Visit

## 2017-09-22 ENCOUNTER — Ambulatory Visit: Payer: Self-pay

## 2017-10-06 ENCOUNTER — Ambulatory Visit
Admission: RE | Admit: 2017-10-06 | Discharge: 2017-10-06 | Disposition: A | Discharge status: Home / Self Care | Payer: Medicare HMO | Source: Ambulatory Visit | Attending: Family Medicine | Admitting: Family Medicine

## 2017-10-06 ENCOUNTER — Other Ambulatory Visit: Payer: Self-pay | Admitting: Family Medicine

## 2017-10-06 DIAGNOSIS — Z1239 Encounter for other screening for malignant neoplasm of breast: Secondary | ICD-10-CM

## 2017-10-06 DIAGNOSIS — Z13820 Encounter for screening for osteoporosis: Secondary | ICD-10-CM | POA: Insufficient documentation

## 2017-10-06 DIAGNOSIS — Z1231 Encounter for screening mammogram for malignant neoplasm of breast: Secondary | ICD-10-CM | POA: Insufficient documentation

## 2017-10-06 DIAGNOSIS — E559 Vitamin D deficiency, unspecified: Secondary | ICD-10-CM | POA: Insufficient documentation

## 2017-10-06 DIAGNOSIS — M810 Age-related osteoporosis without current pathological fracture: Secondary | ICD-10-CM | POA: Diagnosis not present

## 2017-10-06 DIAGNOSIS — E2839 Other primary ovarian failure: Secondary | ICD-10-CM | POA: Diagnosis not present

## 2017-10-06 DIAGNOSIS — M818 Other osteoporosis without current pathological fracture: Secondary | ICD-10-CM | POA: Diagnosis not present

## 2017-10-06 DIAGNOSIS — Z780 Asymptomatic menopausal state: Secondary | ICD-10-CM | POA: Diagnosis not present

## 2017-10-06 HISTORY — DX: Malignant (primary) neoplasm, unspecified: C80.1

## 2017-10-07 ENCOUNTER — Other Ambulatory Visit: Payer: Self-pay

## 2017-10-07 DIAGNOSIS — M85859 Other specified disorders of bone density and structure, unspecified thigh: Secondary | ICD-10-CM

## 2017-10-07 MED ORDER — ALENDRONATE SODIUM 70 MG PO TABS: 70.0000 mg | ORAL_TABLET | ORAL | 5 refills | Status: DC

## 2017-11-19 ENCOUNTER — Encounter: Payer: Self-pay | Admitting: Family Medicine

## 2017-11-19 ENCOUNTER — Ambulatory Visit (INDEPENDENT_AMBULATORY_CARE_PROVIDER_SITE_OTHER): Payer: Medicare HMO | Admitting: Family Medicine

## 2017-11-19 DIAGNOSIS — F32A Depression, unspecified: Secondary | ICD-10-CM

## 2017-11-19 DIAGNOSIS — F329 Major depressive disorder, single episode, unspecified: Secondary | ICD-10-CM | POA: Diagnosis not present

## 2017-11-19 DIAGNOSIS — F419 Anxiety disorder, unspecified: Secondary | ICD-10-CM | POA: Diagnosis not present

## 2017-11-19 MED ORDER — CITALOPRAM HYDROBROMIDE 20 MG PO TABS: 20.0000 mg | ORAL_TABLET | Freq: Every day | ORAL | 2 refills | Status: DC

## 2017-11-19 NOTE — Progress Notes (Signed)
Name: Michelle Willis   MRN: [id number]    DOB: Dec 10, 1950   Date:11/19/2017       Progress Note  Subjective  Chief Complaint  Chief Complaint  Patient presents with  . Depression    Depression         This is a chronic problem.  The current episode started more than 1 year ago.   The onset quality is sudden.   The problem occurs rarely.  The problem has been gradually improving since onset.  Associated symptoms include no decreased concentration, no fatigue, no helplessness, no hopelessness, does not have insomnia, not irritable, no restlessness, no decreased interest, no appetite change, no body aches, no myalgias, no headaches, no indigestion, not sad and no suicidal ideas.     The symptoms are aggravated by nothing.  Past treatments include SSRIs - Selective serotonin reuptake inhibitors.   No problem-specific Assessment & Plan notes found for this encounter.   Past Medical History:  Diagnosis Date  . Anxiety   . Cancer ([place]) 1991   basal cell ca  . Depression     Past Surgical History:  Procedure Laterality Date  . ABDOMINAL HYSTERECTOMY    . CHOLECYSTECTOMY    . VAGINAL HYSTERECTOMY      Family History  Problem Relation Age of Onset  . Cancer Father   . Breast cancer Neg Hx     Social History   Socioeconomic History  . Marital status: Divorced    Spouse name: Not on file  . Number of children: 3  . Years of education: some college  . Highest education level: Some college, no degree  Occupational History  . Occupation: Retired  Scientific laboratory technician  . Financial resource strain: Not hard at all  . Food insecurity:    Worry: Never true    Inability: Never true  . Transportation needs:    Medical: No    Non-medical: No  Tobacco Use  . Smoking status: Never Smoker  . Smokeless tobacco: Never Used  Substance and Sexual Activity  . Alcohol use: No    Alcohol/week: 0.0 oz  . Drug use: No  . Sexual activity: Not Currently  Lifestyle  . Physical activity:    Days  per week: 5 days    Minutes per session: 60 min  . Stress: Not at all  Relationships  . Social connections:    Talks on phone: Patient refused    Gets together: Patient refused    Attends religious service: Patient refused    Active member of club or organization: Patient refused    Attends meetings of clubs or organizations: Patient refused    Relationship status: Divorced  . Intimate partner violence:    Fear of current or ex partner: No    Emotionally abused: No    Physically abused: No    Forced sexual activity: No  Other Topics Concern  . Not on file  Social History Narrative  . Not on file    No Known Allergies  Outpatient Medications Prior to Visit  Medication Sig Dispense Refill  . amitriptyline (ELAVIL) 50 MG tablet Take 1 tablet (50 mg total) by mouth at bedtime. 90 tablet 1  . Ascorbic Acid (VITAMIN C ADULT GUMMIES PO)     . aspirin 325 MG tablet Take 1 tablet by mouth as needed.    . Calcium Carbonate (CALCIUM 600 PO) Take 1 tablet by mouth daily.    . Cholecalciferol (VITAMIN D3 ADULT GUMMIES PO) Take 1  tablet by mouth daily.     . citalopram (CELEXA) 20 MG tablet Take 1 tablet (20 mg total) by mouth daily. 90 tablet 0  . FIBER COMPLETE PO Take 2 tablets by mouth daily at 12 noon.    Marland Kitchen alendronate (FOSAMAX) 70 MG tablet Take 1 tablet (70 mg total) by mouth every 7 (seven) days. Take with a full glass of water on an empty stomach. 4 tablet 5  . calcium elemental as carbonate (BARIATRIC TUMS ULTRA) 400 MG tablet Chew 1 tablet by mouth daily as needed.      No facility-administered medications prior to visit.     Review of Systems  Constitutional: Negative for appetite change, chills, fatigue, fever, malaise/fatigue and weight loss.  HENT: Negative for ear discharge, ear pain and sore throat.   Eyes: Negative for blurred vision.  Respiratory: Negative for cough, sputum production, shortness of breath and wheezing.   Cardiovascular: Negative for chest pain,  palpitations and leg swelling.  Gastrointestinal: Negative for abdominal pain, blood in stool, constipation, diarrhea, heartburn, melena and nausea.  Genitourinary: Negative for dysuria, frequency, hematuria and urgency.  Musculoskeletal: Negative for back pain, joint pain, myalgias and neck pain.  Skin: Negative for rash.  Neurological: Negative for dizziness, tingling, sensory change, focal weakness and headaches.  Endo/Heme/Allergies: Negative for environmental allergies and polydipsia. Does not bruise/bleed easily.  Psychiatric/Behavioral: Positive for depression. Negative for decreased concentration and suicidal ideas. The patient is not nervous/anxious and does not have insomnia.      Objective  Vitals:   11/19/17 1136  BP: 120/80  Pulse: 88  Weight: 126 lb (57.2 kg)  Height: 4\' 11"  (1.499 m)    Physical Exam  Constitutional: She is well-developed, well-nourished, and in no distress. She is not irritable. No distress.  HENT:  Head: Normocephalic and atraumatic.  Right Ear: External ear normal.  Left Ear: External ear normal.  Nose: Nose normal.  Mouth/Throat: Oropharynx is clear and moist.  Eyes: Pupils are equal, round, and reactive to light. Conjunctivae and EOM are normal. Right eye exhibits no discharge. Left eye exhibits no discharge.  Neck: Normal range of motion. Neck supple. No JVD present. No thyromegaly present.  Cardiovascular: Normal rate, regular rhythm, normal heart sounds and intact distal pulses. Exam reveals no gallop and no friction rub.  No murmur heard. Pulmonary/Chest: Effort normal and breath sounds normal. She has no wheezes. She has no rales.  Abdominal: Soft. Bowel sounds are normal. She exhibits no mass. There is no tenderness. There is no guarding.  Musculoskeletal: Normal range of motion. She exhibits no edema.  Lymphadenopathy:    She has no cervical adenopathy.  Neurological: She is alert. She has normal reflexes.  Skin: Skin is warm and dry.  She is not diaphoretic.  Psychiatric: Mood and affect normal.  Nursing note and vitals reviewed.     Assessment & Plan  Problem List Items Addressed This Visit    None      No orders of the defined types were placed in this encounter.     Dr. [facility]  11/19/17

## 2017-11-20 ENCOUNTER — Telehealth: Payer: Self-pay | Admitting: Family Medicine

## 2017-11-20 NOTE — Telephone Encounter (Signed)
Patient is requesting amitriptyline (ELAVIL) 50 MG tablet sent to [facility] in [place].

## 2017-11-20 NOTE — Telephone Encounter (Signed)
Was sent in on 08/28/17 for 90 with 1 refill- should have enough to get through til July at pharmacy

## 2018-04-06 ENCOUNTER — Ambulatory Visit (INDEPENDENT_AMBULATORY_CARE_PROVIDER_SITE_OTHER): Payer: Medicare HMO | Admitting: Family Medicine

## 2018-04-06 ENCOUNTER — Encounter: Payer: Self-pay | Admitting: Family Medicine

## 2018-04-06 ENCOUNTER — Other Ambulatory Visit: Payer: Self-pay | Admitting: Family Medicine

## 2018-04-06 VITALS — BP 128/80 | HR 80 | Ht 59.0 in | Wt 131.0 lb

## 2018-04-06 DIAGNOSIS — F329 Major depressive disorder, single episode, unspecified: Secondary | ICD-10-CM

## 2018-04-06 DIAGNOSIS — F419 Anxiety disorder, unspecified: Secondary | ICD-10-CM

## 2018-04-06 DIAGNOSIS — F331 Major depressive disorder, recurrent, moderate: Secondary | ICD-10-CM

## 2018-04-06 DIAGNOSIS — F5101 Primary insomnia: Secondary | ICD-10-CM | POA: Diagnosis not present

## 2018-04-06 MED ORDER — AMITRIPTYLINE HCL 50 MG PO TABS: 50.0000 mg | ORAL_TABLET | Freq: Every day | ORAL | 3 refills | Status: DC

## 2018-04-06 MED ORDER — CITALOPRAM HYDROBROMIDE 20 MG PO TABS: 20.0000 mg | ORAL_TABLET | Freq: Every day | ORAL | 3 refills | Status: DC

## 2018-04-06 NOTE — Assessment & Plan Note (Signed)
Chronic Stable. Continue amitriptyline 50 mg q hs.

## 2018-04-06 NOTE — Progress Notes (Signed)
Name: Michelle Willis   MRN: [id number]    DOB: 1951/05/06   Date:04/06/2018       Progress Note  Subjective  Chief Complaint  Chief Complaint  Patient presents with  . Insomnia    needs refill on Amitriptyline    Insomnia  Primary symptoms: no fragmented sleep, no sleep disturbance, no difficulty falling asleep, no somnolence, no frequent awakening, no premature morning awakening, no malaise/fatigue, no napping.   The current episode started more than one year. The onset quality is gradual. The problem occurs intermittently. The problem has been gradually improving since onset. How many beverages per day that contain caffeine: 0 - 1.  Past treatments include medication. The treatment provided moderate relief. PMH includes: no depression.    Primary insomnia Chronic Stable. Continue amitriptyline 50 mg q hs.    Past Medical History:  Diagnosis Date  . Anxiety   . Cancer ([place]) 1991   basal cell ca  . Depression     Past Surgical History:  Procedure Laterality Date  . ABDOMINAL HYSTERECTOMY    . CHOLECYSTECTOMY    . VAGINAL HYSTERECTOMY      Family History  Problem Relation Age of Onset  . Cancer Father   . Breast cancer Neg Hx     Social History   Socioeconomic History  . Marital status: Divorced    Spouse name: Not on file  . Number of children: 3  . Years of education: some college  . Highest education level: Some college, no degree  Occupational History  . Occupation: Retired  Scientific laboratory technician  . Financial resource strain: Not hard at all  . Food insecurity:    Worry: Never true    Inability: Never true  . Transportation needs:    Medical: No    Non-medical: No  Tobacco Use  . Smoking status: Never Smoker  . Smokeless tobacco: Never Used  Substance and Sexual Activity  . Alcohol use: No    Alcohol/week: 0.0 standard drinks  . Drug use: No  . Sexual activity: Not Currently  Lifestyle  . Physical activity:    Days per week: 5 days    Minutes per  session: 60 min  . Stress: Not at all  Relationships  . Social connections:    Talks on phone: Patient refused    Gets together: Patient refused    Attends religious service: Patient refused    Active member of club or organization: Patient refused    Attends meetings of clubs or organizations: Patient refused    Relationship status: Divorced  . Intimate partner violence:    Fear of current or ex partner: No    Emotionally abused: No    Physically abused: No    Forced sexual activity: No  Other Topics Concern  . Not on file  Social History Narrative  . Not on file    No Known Allergies  Outpatient Medications Prior to Visit  Medication Sig Dispense Refill  . Ascorbic Acid (VITAMIN C ADULT GUMMIES PO)     . aspirin 325 MG tablet Take 1 tablet by mouth as needed.    . Calcium Carbonate (CALCIUM 600 PO) Take 1 tablet by mouth daily.    . Cholecalciferol (VITAMIN D3 ADULT GUMMIES PO) Take 1 tablet by mouth daily.     Marland Kitchen FIBER COMPLETE PO Take 2 tablets by mouth daily at 12 noon.    Marland Kitchen amitriptyline (ELAVIL) 50 MG tablet TAKE 1 TABLET BY MOUTH AT BEDTIME 30 tablet  0  . citalopram (CELEXA) 20 MG tablet Take 1 tablet (20 mg total) by mouth daily. 90 tablet 2   No facility-administered medications prior to visit.     Review of Systems  Constitutional: Negative for chills, fever, malaise/fatigue and weight loss.  HENT: Negative for ear discharge, ear pain and sore throat.   Eyes: Negative for blurred vision.  Respiratory: Negative for cough, sputum production, shortness of breath and wheezing.   Cardiovascular: Negative for chest pain, palpitations and leg swelling.  Gastrointestinal: Negative for abdominal pain, blood in stool, constipation, diarrhea, heartburn, melena and nausea.  Genitourinary: Negative for dysuria, frequency, hematuria and urgency.  Musculoskeletal: Negative for back pain, joint pain, myalgias and neck pain.  Skin: Negative for rash.  Neurological: Negative for  dizziness, tingling, sensory change, focal weakness and headaches.  Endo/Heme/Allergies: Negative for environmental allergies and polydipsia. Does not bruise/bleed easily.  Psychiatric/Behavioral: Negative for depression, sleep disturbance and suicidal ideas. The patient has insomnia. The patient is not nervous/anxious.      Objective  Vitals:   04/06/18 1356  BP: 128/80  Pulse: 80  Weight: 131 lb (59.4 kg)  Height: 4\' 11"  (1.499 m)    Physical Exam  Constitutional: She is oriented to person, place, and time. She appears well-developed and well-nourished.  HENT:  Head: Normocephalic.  Right Ear: External ear normal.  Left Ear: External ear normal.  Mouth/Throat: Oropharynx is clear and moist.  Eyes: Pupils are equal, round, and reactive to light. Conjunctivae and EOM are normal. Lids are everted and swept, no foreign bodies found. Left eye exhibits no hordeolum. No foreign body present in the left eye. Right conjunctiva is not injected. Left conjunctiva is not injected. No scleral icterus.  Neck: Normal range of motion. Neck supple. No JVD present. No tracheal deviation present. No thyromegaly present.  Cardiovascular: Normal rate, regular rhythm, normal heart sounds and intact distal pulses. Exam reveals no gallop and no friction rub.  No murmur heard. Pulmonary/Chest: Effort normal and breath sounds normal. No respiratory distress. She has no wheezes. She has no rales.  Abdominal: Soft. Bowel sounds are normal. She exhibits no mass. There is no hepatosplenomegaly. There is no tenderness. There is no rebound and no guarding.  Musculoskeletal: Normal range of motion. She exhibits no edema or tenderness.  Lymphadenopathy:    She has no cervical adenopathy.  Neurological: She is alert and oriented to person, place, and time. She has normal strength. She displays normal reflexes. No cranial nerve deficit.  Skin: Skin is warm. No rash noted.  Psychiatric: She has a normal mood and  affect. Her mood appears not anxious. She does not exhibit a depressed mood.  Nursing note and vitals reviewed.     Assessment & Plan  Problem List Items Addressed This Visit      Other   Anxiety and depression - Primary   Relevant Medications   amitriptyline (ELAVIL) 50 MG tablet   citalopram (CELEXA) 20 MG tablet   Primary insomnia    Chronic Stable. Continue amitriptyline 50 mg q hs.        Other Visit Diagnoses    Major depressive disorder, recurrent episode, moderate (HCC)       Patient refilled celexa 20 mg q day. recheck in 6-12 months   Relevant Medications   amitriptyline (ELAVIL) 50 MG tablet   citalopram (CELEXA) 20 MG tablet      Meds ordered this encounter  Medications  . amitriptyline (ELAVIL) 50 MG tablet  Sig: Take 1 tablet (50 mg total) by mouth at bedtime.    Dispense:  90 tablet    Refill:  3  . citalopram (CELEXA) 20 MG tablet    Sig: Take 1 tablet (20 mg total) by mouth daily.    Dispense:  90 tablet    Refill:  3      Dr. Otilio Miu [facility]  04/06/18

## 2018-04-29 ENCOUNTER — Ambulatory Visit (INDEPENDENT_AMBULATORY_CARE_PROVIDER_SITE_OTHER): Payer: Medicare HMO | Admitting: Family Medicine

## 2018-04-29 ENCOUNTER — Encounter: Payer: Self-pay | Admitting: Family Medicine

## 2018-04-29 VITALS — BP 110/76 | HR 80 | Ht 59.0 in | Wt 128.0 lb

## 2018-04-29 DIAGNOSIS — B372 Candidiasis of skin and nail: Secondary | ICD-10-CM

## 2018-04-29 MED ORDER — NYSTATIN-TRIAMCINOLONE 100000-0.1 UNIT/GM-% EX CREA: 1.0000 "application " | TOPICAL_CREAM | Freq: Two times a day (BID) | CUTANEOUS | 0 refills | Status: DC

## 2018-04-29 MED ORDER — FLUCONAZOLE 150 MG PO TABS: 150.0000 mg | ORAL_TABLET | Freq: Once | ORAL | 0 refills | Status: AC

## 2018-04-29 NOTE — Progress Notes (Signed)
Name: Michelle Willis   MRN: [id number]    DOB: 03-31-1951   Date:04/29/2018       Progress Note  Subjective  Chief Complaint  Chief Complaint  Patient presents with  . Rash    across stomach- swims in pool at gym. pink bumps that itch    Rash  This is a recurrent problem. The current episode started 1 to 4 weeks ago. The problem has been gradually worsening since onset. The affected locations include the torso and abdomen. She was exposed to nothing. Pertinent negatives include no cough, diarrhea, fever, joint pain, shortness of breath or sore throat. Past treatments include topical steroids. The treatment provided moderate relief. There is no history of allergies or eczema.    No problem-specific Assessment & Plan notes found for this encounter.   Past Medical History:  Diagnosis Date  . Anxiety   . Cancer ([place]) 1991   basal cell ca  . Depression     Past Surgical History:  Procedure Laterality Date  . ABDOMINAL HYSTERECTOMY    . CHOLECYSTECTOMY    . VAGINAL HYSTERECTOMY      Family History  Problem Relation Age of Onset  . Cancer Father   . Breast cancer Neg Hx     Social History   Socioeconomic History  . Marital status: Divorced    Spouse name: Not on file  . Number of children: 3  . Years of education: some college  . Highest education level: Some college, no degree  Occupational History  . Occupation: Retired  Scientific laboratory technician  . Financial resource strain: Not hard at all  . Food insecurity:    Worry: Never true    Inability: Never true  . Transportation needs:    Medical: No    Non-medical: No  Tobacco Use  . Smoking status: Never Smoker  . Smokeless tobacco: Never Used  Substance and Sexual Activity  . Alcohol use: No    Alcohol/week: 0.0 standard drinks  . Drug use: No  . Sexual activity: Not Currently  Lifestyle  . Physical activity:    Days per week: 5 days    Minutes per session: 60 min  . Stress: Not at all  Relationships  . Social  connections:    Talks on phone: Patient refused    Gets together: Patient refused    Attends religious service: Patient refused    Active member of club or organization: Patient refused    Attends meetings of clubs or organizations: Patient refused    Relationship status: Divorced  . Intimate partner violence:    Fear of current or ex partner: No    Emotionally abused: No    Physically abused: No    Forced sexual activity: No  Other Topics Concern  . Not on file  Social History Narrative  . Not on file    No Known Allergies  Outpatient Medications Prior to Visit  Medication Sig Dispense Refill  . amitriptyline (ELAVIL) 50 MG tablet Take 1 tablet (50 mg total) by mouth at bedtime. 90 tablet 3  . Ascorbic Acid (VITAMIN C ADULT GUMMIES PO)     . aspirin 325 MG tablet Take 1 tablet by mouth as needed.    . Calcium Carbonate (CALCIUM 600 PO) Take 1 tablet by mouth daily.    . Cholecalciferol (VITAMIN D3 ADULT GUMMIES PO) Take 1 tablet by mouth daily.     . citalopram (CELEXA) 20 MG tablet Take 1 tablet (20 mg total) by mouth  daily. 90 tablet 3  . FIBER COMPLETE PO Take 2 tablets by mouth daily at 12 noon.     No facility-administered medications prior to visit.     Review of Systems  Constitutional: Negative for chills, fever, malaise/fatigue and weight loss.  HENT: Negative for ear discharge, ear pain and sore throat.   Eyes: Negative for blurred vision.  Respiratory: Negative for cough, sputum production, shortness of breath and wheezing.   Cardiovascular: Negative for chest pain, palpitations and leg swelling.  Gastrointestinal: Negative for abdominal pain, blood in stool, constipation, diarrhea, heartburn, melena and nausea.  Genitourinary: Negative for dysuria, frequency, hematuria and urgency.  Musculoskeletal: Negative for back pain, joint pain, myalgias and neck pain.  Skin: Positive for rash.  Neurological: Negative for dizziness, tingling, sensory change, focal  weakness and headaches.  Endo/Heme/Allergies: Negative for environmental allergies and polydipsia. Does not bruise/bleed easily.  Psychiatric/Behavioral: Negative for depression and suicidal ideas. The patient is not nervous/anxious and does not have insomnia.      Objective  Vitals:   04/29/18 1035  BP: 110/76  Pulse: 80  Weight: 128 lb (58.1 kg)  Height: 4\' 11"  (1.499 m)    Physical Exam  Constitutional: She is oriented to person, place, and time. She appears well-developed and well-nourished.  HENT:  Head: Normocephalic.  Right Ear: External ear normal.  Left Ear: External ear normal.  Mouth/Throat: Oropharynx is clear and moist.  Eyes: Pupils are equal, round, and reactive to light. Conjunctivae and EOM are normal. Lids are everted and swept, no foreign bodies found. Left eye exhibits no hordeolum. No foreign body present in the left eye. Right conjunctiva is not injected. Left conjunctiva is not injected. No scleral icterus.  Neck: Normal range of motion. Neck supple. No JVD present. No tracheal deviation present. No thyromegaly present.  Cardiovascular: Normal rate, regular rhythm, normal heart sounds and intact distal pulses. Exam reveals no gallop and no friction rub.  No murmur heard. Pulmonary/Chest: Effort normal and breath sounds normal. No respiratory distress. She has no wheezes. She has no rales.  Abdominal: Soft. Bowel sounds are normal. She exhibits no mass. There is no hepatosplenomegaly. There is no tenderness. There is no rebound and no guarding.  Musculoskeletal: Normal range of motion. She exhibits no edema or tenderness.  Lymphadenopathy:    She has no cervical adenopathy.  Neurological: She is alert and oriented to person, place, and time. She has normal strength. She displays normal reflexes. No cranial nerve deficit.  Skin: Skin is warm. Rash noted. There is erythema.  Beneath breasts and waist  Psychiatric: She has a normal mood and affect. Her mood  appears not anxious. She does not exhibit a depressed mood.  Nursing note and vitals reviewed.     Assessment & Plan  Problem List Items Addressed This Visit    None    Visit Diagnoses    Candidiasis of skin    -  Primary   Acute. Recurrent. C/W candidiasis along skin folds. Treat with mycolog cream bid and diflucan.    Relevant Medications   nystatin-triamcinolone (MYCOLOG II) cream   fluconazole (DIFLUCAN) 150 MG tablet      Meds ordered this encounter  Medications  . nystatin-triamcinolone (MYCOLOG II) cream    Sig: Apply 1 application topically 2 (two) times daily.    Dispense:  30 g    Refill:  0  . fluconazole (DIFLUCAN) 150 MG tablet    Sig: Take 1 tablet (150 mg total) by mouth  once for 1 dose.    Dispense:  1 tablet    Refill:  0      Dr. Otilio Miu [facility]  04/29/18

## 2018-05-22 ENCOUNTER — Ambulatory Visit: Payer: Self-pay | Admitting: Family Medicine

## 2018-08-05 ENCOUNTER — Ambulatory Visit
Admission: RE | Admit: 2018-08-05 | Discharge: 2018-08-05 | Disposition: A | Discharge status: Home / Self Care | Payer: Medicare HMO | Source: Ambulatory Visit | Attending: Family Medicine | Admitting: Family Medicine

## 2018-08-05 ENCOUNTER — Encounter: Payer: Self-pay | Admitting: Family Medicine

## 2018-08-05 ENCOUNTER — Ambulatory Visit (INDEPENDENT_AMBULATORY_CARE_PROVIDER_SITE_OTHER): Payer: Medicare HMO | Admitting: Family Medicine

## 2018-08-05 ENCOUNTER — Ambulatory Visit
Admission: RE | Admit: 2018-08-05 | Discharge: 2018-08-05 | Disposition: A | Discharge status: Home / Self Care | Payer: Medicare HMO | Attending: Family Medicine | Admitting: Family Medicine

## 2018-08-05 VITALS — BP 126/68 | HR 86 | Ht 59.0 in | Wt 131.0 lb

## 2018-08-05 DIAGNOSIS — S39012A Strain of muscle, fascia and tendon of lower back, initial encounter: Secondary | ICD-10-CM | POA: Insufficient documentation

## 2018-08-05 DIAGNOSIS — M545 Low back pain: Secondary | ICD-10-CM | POA: Diagnosis not present

## 2018-08-05 MED ORDER — MELOXICAM 7.5 MG PO TABS: 7.5000 mg | ORAL_TABLET | Freq: Every day | ORAL | 0 refills | Status: DC

## 2018-08-05 MED ORDER — CYCLOBENZAPRINE HCL 5 MG PO TABS: 5.0000 mg | ORAL_TABLET | Freq: Two times a day (BID) | ORAL | 1 refills | Status: DC | PRN

## 2018-08-05 NOTE — Progress Notes (Signed)
Date:  08/05/2018   Name:  Michelle Willis   DOB:  02-19-1951   MRN:  [id number]   Chief Complaint: Back Pain (X 3 weeks. Unsure of the cause. Stabbing and sharp shooting pains. Keeping her up at night. Pain is located to the lower right side of spine.) Back Pain  This is a new problem. The current episode started 1 to 4 weeks ago (nov 21). The problem occurs intermittently. The problem has been gradually improving since onset. The pain is present in the lumbar spine (paraspinal area). The quality of the pain is described as aching. The pain does not radiate. The pain is at a severity of 5/10. The pain is mild. Pertinent negatives include no abdominal pain, bladder incontinence, bowel incontinence, chest pain, dysuria, fever, headaches, leg pain, numbness, paresis, paresthesias, pelvic pain, tingling, weakness or weight loss. Treatments tried: tylenol. The treatment provided mild relief.     Review of Systems  Constitutional: Negative.  Negative for chills, fatigue, fever, unexpected weight change and weight loss.  HENT: Negative for congestion, ear discharge, ear pain, rhinorrhea, sinus pressure, sneezing and sore throat.   Eyes: Negative for photophobia, pain, discharge, redness and itching.  Respiratory: Negative for cough, shortness of breath, wheezing and stridor.   Cardiovascular: Negative for chest pain.  Gastrointestinal: Negative for abdominal pain, blood in stool, bowel incontinence, constipation, diarrhea, nausea and vomiting.  Endocrine: Negative for cold intolerance, heat intolerance, polydipsia, polyphagia and polyuria.  Genitourinary: Negative for bladder incontinence, dysuria, flank pain, frequency, hematuria, menstrual problem, pelvic pain, urgency, vaginal bleeding and vaginal discharge.  Musculoskeletal: Positive for back pain. Negative for arthralgias and myalgias.  Skin: Negative for rash.  Allergic/Immunologic: Negative for environmental allergies and food allergies.    Neurological: Negative for dizziness, tingling, weakness, light-headedness, numbness, headaches and paresthesias.  Hematological: Negative for adenopathy. Does not bruise/bleed easily.  Psychiatric/Behavioral: Negative for dysphoric mood. The patient is not nervous/anxious.     Patient Active Problem List   Diagnosis Date Noted  . Primary insomnia 03/13/2017  . Essential hypertension 09/30/2016  . Anxiety and depression 09/30/2016    No Known Allergies  Past Surgical History:  Procedure Laterality Date  . ABDOMINAL HYSTERECTOMY    . CHOLECYSTECTOMY    . VAGINAL HYSTERECTOMY      Social History   Tobacco Use  . Smoking status: Never Smoker  . Smokeless tobacco: Never Used  Substance Use Topics  . Alcohol use: No    Alcohol/week: 0.0 standard drinks  . Drug use: No     Medication list has been reviewed and updated.  Current Meds  Medication Sig  . amitriptyline (ELAVIL) 50 MG tablet Take 1 tablet (50 mg total) by mouth at bedtime.  . Ascorbic Acid (VITAMIN C ADULT GUMMIES PO)   . aspirin 325 MG tablet Take 1 tablet by mouth as needed.  . Calcium Carbonate (CALCIUM 600 PO) Take 1 tablet by mouth daily.  . Cholecalciferol (VITAMIN D3 ADULT GUMMIES PO) Take 1 tablet by mouth daily.   . citalopram (CELEXA) 20 MG tablet Take 1 tablet (20 mg total) by mouth daily.  Mar[place] Kitchen FIBER COMPLETE PO Take 2 tablets by mouth daily at 12 noon.    PHQ 2/9 Scores 08/05/2018 04/29/2018 04/06/2018 09/03/2017  PHQ - 2 Score 0 0 0 0  PHQ- 9 Score 0 0 0 0    Physical Exam  Constitutional: She is oriented to person, place, and time. She appears well-developed and well-nourished.  HENT:  Head:  Normocephalic.  Right Ear: External ear normal.  Left Ear: External ear normal.  Mouth/Throat: Oropharynx is clear and moist.  Eyes: Pupils are equal, round, and reactive to light. Conjunctivae and EOM are normal. Lids are everted and swept, no foreign bodies found. Left eye exhibits no hordeolum. No  foreign body present in the left eye. Right conjunctiva is not injected. Left conjunctiva is not injected. No scleral icterus.  Neck: Normal range of motion. Neck supple. No JVD present. No tracheal deviation present. No thyromegaly present.  Cardiovascular: Normal rate, regular rhythm, normal heart sounds and intact distal pulses. Exam reveals no gallop and no friction rub.  No murmur heard. Pulmonary/Chest: Effort normal and breath sounds normal. No respiratory distress. She has no wheezes. She has no rales.  Abdominal: Soft. Bowel sounds are normal. She exhibits no mass. There is no hepatosplenomegaly. There is no tenderness. There is no rebound and no guarding.  Musculoskeletal: Normal range of motion. She exhibits no edema or tenderness.       Lumbar back: She exhibits spasm.  si tenderness  Lymphadenopathy:    She has no cervical adenopathy.  Neurological: She is alert and oriented to person, place, and time. She has normal strength. She displays normal reflexes. No cranial nerve deficit.  Skin: Skin is warm. No rash noted.  Psychiatric: She has a normal mood and affect. Her mood appears not anxious. She does not exhibit a depressed mood.  Nursing note and vitals reviewed.   BP (!) 196/68   Pulse 86   Ht 4\' 11"  (1.499 m)   Wt 131 lb (59.4 kg)   SpO2 98%   BMI 26.46 kg/m   Assessment and Plan:  1. Strain of lumbar region, initial encounter Acute.  No history of trauma.  Patient has history of osteopenia for which she takes vitamin D and calcium supplement.  Will obtain an lumbar spine x-ray to rule out patient fracture.  Initiate back 7.5 mg 1 a day and cyclobenzaprine primarily at night. - DG Lumbar Spine Complete; Future - meloxicam (MOBIC) 7.5 MG tablet; Take 1 tablet (7.5 mg total) by mouth daily.  Dispense: 30 tablet; Refill: 0 - cyclobenzaprine (FLEXERIL) 5 MG tablet; Take 1 tablet (5 mg total) by mouth 2 (two) times daily as needed for muscle spasms.  Dispense: 30 tablet;  Refill: 1   Dr. [facility]  08/05/2018

## 2018-09-07 ENCOUNTER — Ambulatory Visit: Payer: Self-pay

## 2018-09-21 ENCOUNTER — Ambulatory Visit: Payer: Self-pay

## 2018-12-03 ENCOUNTER — Telehealth: Payer: Self-pay | Admitting: Family Medicine

## 2018-12-03 NOTE — Telephone Encounter (Signed)
Called to RE-schedule Medicare Annual Wellness Visit with Nurse Health Advisor.  ° °If patient returns call, please schedule AWV with NHA ~~AFTER June 1ST~~ ° ° °For any questions please contact:  °Kathryn Brown [phone] or skype at: [email or web address]  ° ° °

## 2018-12-04 NOTE — Telephone Encounter (Signed)
2ND ATTEMPT

## 2018-12-17 ENCOUNTER — Encounter: Payer: Self-pay | Admitting: Family Medicine

## 2018-12-17 NOTE — Telephone Encounter (Signed)
3rd attempt

## 2018-12-30 ENCOUNTER — Ambulatory Visit (INDEPENDENT_AMBULATORY_CARE_PROVIDER_SITE_OTHER): Payer: Medicare HMO

## 2018-12-30 VITALS — Ht 59.0 in | Wt 125.0 lb

## 2018-12-30 DIAGNOSIS — Z0000 Encounter for general adult medical examination without abnormal findings: Secondary | ICD-10-CM | POA: Diagnosis not present

## 2018-12-30 DIAGNOSIS — Z1231 Encounter for screening mammogram for malignant neoplasm of breast: Secondary | ICD-10-CM | POA: Diagnosis not present

## 2018-12-30 NOTE — Progress Notes (Signed)
Subjective:   Michelle Willis is a 68 y.o. female who presents for Medicare Annual (Subsequent) preventive examination.  Virtual Visit via Telephone Note  I connected with Michelle Willis on 12/30/18 at  1:20 PM EDT by telephone and verified that I am speaking with the correct person using two identifiers.  Medicare Annual Wellness visit completed telephonically due to Covid-19 pandemic.   Some vital signs may be absent or patient reported. Patient unable to check blood pressure due to she does not have a monitor at home.   Location: Patient: home  Provider: office   I discussed the limitations, risks, security and privacy concerns of performing an evaluation and management service by telephone and the availability of in person appointments. The patient expressed understanding and agreed to proceed.   Michelle Marker, Willis  Review of Systems:   Cardiac Risk Factors include: advanced age (>59men, >65 women)     Objective:     Vitals: Ht 4\' 11"  (1.499 m)   Wt 125 lb (56.7 kg)   BMI 25.25 kg/m   Body mass index is 25.25 kg/m.  Advanced Directives 12/30/2018 09/03/2017 03/28/2015  Does Patient Have a Medical Advance Directive? Yes Yes Yes  Type of Paramedic of [place];Living will [place];Living will Living will  Copy of [place] in Chart? No - copy requested No - copy requested No - copy requested    Tobacco Social History   Tobacco Use  Smoking Status Never Smoker  Smokeless Tobacco Never Used     Counseling given: Not Answered   Clinical Intake:  Pre-visit preparation completed: Yes  Pain : No/denies pain     Nutritional Risks: None Diabetes: No  How often do you need to have someone help you when you read instructions, pamphlets, or other written materials from your doctor or pharmacy?: 1 - Never  Interpreter Needed?: No  Information entered by :: Michelle Willis  Past Medical History:   Diagnosis Date  . Anxiety   . Cancer ([place]) 1991   basal cell ca  . Depression    Past Surgical History:  Procedure Laterality Date  . ABDOMINAL HYSTERECTOMY    . CHOLECYSTECTOMY    . VAGINAL HYSTERECTOMY     Family History  Problem Relation Age of Onset  . Cancer Father   . Breast cancer Neg Hx    Social History   Socioeconomic History  . Marital status: Divorced    Spouse name: Not on file  . Number of children: 3  . Years of education: some college  . Highest education level: Some college, no degree  Occupational History  . Occupation: Retired  Scientific laboratory technician  . Financial resource strain: Not hard at all  . Food insecurity:    Worry: Never true    Inability: Never true  . Transportation needs:    Medical: No    Non-medical: No  Tobacco Use  . Smoking status: Never Smoker  . Smokeless tobacco: Never Used  Substance and Sexual Activity  . Alcohol use: No    Alcohol/week: 0.0 standard drinks  . Drug use: No  . Sexual activity: Not Currently  Lifestyle  . Physical activity:    Days per week: 5 days    Minutes per session: 60 min  . Stress: Not at all  Relationships  . Social connections:    Talks on phone: More than three times a week    Gets together: More than three  times a week    Attends religious service: Never    Active member of club or organization: No    Attends meetings of clubs or organizations: Never    Relationship status: Divorced  Other Topics Concern  . Not on file  Social History Narrative  . Not on file    Outpatient Encounter Medications as of 12/30/2018  Medication Sig  . amitriptyline (ELAVIL) 50 MG tablet Take 1 tablet (50 mg total) by mouth at bedtime.  . Ascorbic Acid (VITAMIN C ADULT GUMMIES PO)   . Calcium Carbonate (CALCIUM 600 PO) Take 1 tablet by mouth daily.  . Cholecalciferol (VITAMIN D3 ADULT GUMMIES PO) Take 1 tablet by mouth daily.   . citalopram (CELEXA) 20 MG tablet Take 1 tablet (20 mg total) by mouth daily.  .  [DISCONTINUED] aspirin 325 MG tablet Take 1 tablet by mouth as needed.  . [DISCONTINUED] cyclobenzaprine (FLEXERIL) 5 MG tablet Take 1 tablet (5 mg total) by mouth 2 (two) times daily as needed for muscle spasms.  . [DISCONTINUED] FIBER COMPLETE PO Take 2 tablets by mouth daily at 12 noon.  . [DISCONTINUED] meloxicam (MOBIC) 7.5 MG tablet Take 1 tablet (7.5 mg total) by mouth daily.   No facility-administered encounter medications on file as of 12/30/2018.     Activities of Daily Living In your present state of health, do you have any difficulty performing the following activities: 12/30/2018  Hearing? N  Comment declines hearing aids  Vision? N  Comment reading glasses  Difficulty concentrating or making decisions? N  Walking or climbing stairs? N  Dressing or bathing? N  Doing errands, shopping? N  Preparing Food and eating ? N  Using the Toilet? N  In the past six months, have you accidently leaked urine? N  Do you have problems with loss of bowel control? N  Managing your Medications? N  Managing your Finances? N  Housekeeping or managing your Housekeeping? N  Some recent data might be hidden    Patient Care Team: Michelle Patch, MD as PCP - General (Family Medicine)    Assessment:   This is a routine wellness examination for Michelle Willis.  Exercise Activities and Dietary recommendations Current Exercise Habits: Home exercise routine, Type of exercise: Other - see comments(swimming, yard work), Time (Minutes): 60, Frequency (Times/Week): 5, Weekly Exercise (Minutes/Week): 300, Intensity: Moderate, Exercise limited by: None identified  Goals    . DIET - INCREASE WATER INTAKE     Recommend to drink at least 6-8 8oz glasses of water per day       Fall Risk Fall Risk  12/30/2018 09/03/2017 08/28/2017 06/25/2016  Falls in the past year? 0 No No No  Number falls in past yr: 0 - - -  Injury with Fall? 0 - - -  Follow up Falls prevention discussed - - -   FALL RISK PREVENTION  PERTAINING TO THE HOME:  Any stairs in or around the home? No  If so, do they handrails? No   Home free of loose throw rugs in walkways, pet beds, electrical cords, etc? Yes  Adequate lighting in your home to reduce risk of falls? Yes   ASSISTIVE DEVICES UTILIZED TO PREVENT FALLS:  Life alert? No  Use of a cane, walker or w/c? No  Grab bars in the bathroom? No  Shower chair or bench in shower? No  Elevated toilet seat or a handicapped toilet? No   DME ORDERS:  DME order needed?  No   TIMED  UP AND GO:  Was the test performed? No . Telephonic visit.   Education: Fall risk prevention has been discussed.  Intervention(s) required? No   Depression Screen PHQ 2/9 Scores 12/30/2018 08/05/2018 04/29/2018 04/06/2018  PHQ - 2 Score 0 0 0 0  PHQ- 9 Score 0 0 0 0     Cognitive Function     6CIT Screen 12/30/2018 09/03/2017  What Year? 0 points 0 points  What month? 0 points 0 points  What time? 0 points 0 points  Count back from 20 0 points 0 points  Months in reverse 0 points 0 points  Repeat phrase 0 points 0 points  Total Score 0 0     There is no immunization history on file for this patient.  Qualifies for Shingles Vaccine? Yes . Due for Shingrix. Education has been provided regarding the importance of this vaccine. Pt has been advised to call insurance company to determine out of pocket expense. Advised may also receive vaccine at local pharmacy or Health Dept. Verbalized acceptance and understanding.  Tdap: Although this vaccine is not a covered service during a Wellness Exam, does the patient still wish to receive this vaccine today?  No .  Education has been provided regarding the importance of this vaccine. Advised may receive this vaccine at local pharmacy or Health Dept. Aware to provide a copy of the vaccination record if obtained from local pharmacy or Health Dept. Verbalized acceptance and understanding.  Flu Vaccine: Due for Flu vaccine. Does the patient want to  receive this vaccine today?  No . Education has been provided regarding the importance of this vaccine but still declined. Advised may receive this vaccine at local pharmacy or Health Dept. Aware to provide a copy of the vaccination record if obtained from local pharmacy or Health Dept. Verbalized acceptance and understanding.  Pneumococcal Vaccine: Due for Pneumococcal vaccine. Does the patient want to receive this vaccine today?  No . Education has been provided regarding the importance of this vaccine but still declined. Advised may receive this vaccine at local pharmacy or Health Dept. Aware to provide a copy of the vaccination record if obtained from local pharmacy or Health Dept. Verbalized acceptance and understanding.   Screening Tests Health Maintenance  Topic Date Due  . Hepatitis C Screening  09/05/1950  . TETANUS/TDAP  12/14/1967  . COLONOSCOPY  12/14/1998  . PNA vac Low Risk Adult (1 of 2 - PCV13) 12/13/2013  . MAMMOGRAM  10/06/2018  . INFLUENZA VACCINE  03/27/2019  . DEXA SCAN  Completed    Cancer Screenings:  Colorectal Screening: DUE. Pt declines this screening for now, would like to discuss at next appt.   Mammogram: Completed 10/06/17. Repeat every year;  Ordered today. Pt provided with contact information and advised to call to schedule appt.   Bone Density: Completed 10/06/17. Results reflect  OSTEOPOROSIS. Repeat every 2 years.   Lung Cancer Screening: (Low Dose CT Chest recommended if Age 36-80 years, 30 pack-year currently smoking OR have quit w/in 15years.) does not qualify.   Additional Screening:  Hepatitis C Screening: does qualify; postponed  Vision Screening: Recommended annual ophthalmology exams for early detection of glaucoma and other disorders of the eye. Is the patient up to date with their annual eye exam?  No  Who is the provider or what is the name of the office in which the pt attends annual eye exams? Not established  Dental Screening:  Recommended annual dental exams for proper oral hygiene  Community  Resource Referral:  CRR required this visit?  No       Plan:    I have personally reviewed and addressed the Medicare Annual Wellness questionnaire and have noted the following in the patient's chart:  A. Medical and social history B. Use of alcohol, tobacco or illicit drugs  C. Current medications and supplements D. Functional ability and status E.  Nutritional status F.  Physical activity G. Advance directives H. List of other physicians I.  Hospitalizations, surgeries, and ER visits in previous 12 months J.  [place] such as hearing and vision if needed, cognitive and depression L. Referrals and appointments   In addition, I have reviewed and discussed with patient certain preventive protocols, quality metrics, and best practice recommendations. A written personalized care plan for preventive services as well as general preventive health recommendations were provided to patient.   Signed,  Michelle Marker, Willis Nurse Health Advisor   Nurse Notes: pt doing well and appreciative of telephonic visit today

## 2018-12-30 NOTE — Patient Instructions (Signed)
Michelle Willis , Thank you for taking time to come for your Medicare Wellness Visit. I appreciate your ongoing commitment to your health goals. Please review the following plan we discussed and let me know if I can assist you in the future.   Screening recommendations/referrals: Colonoscopy: due - please discuss with Dr. Ronnald Ramp at your next appointment.  Mammogram: done 10/06/17. Please call [phone] to schedule your mammogram.  Bone Density: done 10/06/17 Recommended yearly ophthalmology/optometry visit for glaucoma screening and checkup Recommended yearly dental visit for hygiene and checkup  Vaccinations: Influenza vaccine: postponed Pneumococcal vaccine: postponed Tdap vaccine: DUE please contact us if you get a cut or scrape Shingles vaccine: Shingrix discussed. Please contact your pharmacy for coverage information.   Advanced directives: Please bring a copy of your health care power of attorney and living will to the office at your convenience.  Conditions/risks identified: Keep up the great work!  Next appointment: Please follow up in one year for your Medicare Annual Wellness visit.     Preventive Care 49 Years and Older, Female Preventive care refers to lifestyle choices and visits with your health care provider that can promote health and wellness. What does preventive care include?  A yearly physical exam. This is also called an annual well check.  Dental exams once or twice a year.  Routine eye exams. Ask your health care provider how often you should have your eyes checked.  Personal lifestyle choices, including:  Daily care of your teeth and gums.  Regular physical activity.  Eating a healthy diet.  Avoiding tobacco and drug use.  Limiting alcohol use.  Practicing safe sex.  Taking low-dose aspirin every day.  Taking vitamin and mineral supplements as recommended by your health care provider. What happens during an annual well check? The services and  screenings done by your health care provider during your annual well check will depend on your age, overall health, lifestyle risk factors, and family history of disease. Counseling  Your health care provider may ask you questions about your:  Alcohol use.  Tobacco use.  Drug use.  Emotional well-being.  Home and relationship well-being.  Sexual activity.  Eating habits.  History of falls.  Memory and ability to understand (cognition).  Work and work Statistician.  Reproductive health. Screening  You may have the following tests or measurements:  Height, weight, and BMI.  Blood pressure.  Lipid and cholesterol levels. These may be checked every 5 years, or more frequently if you are over 81 years old.  Skin check.  Lung cancer screening. You may have this screening every year starting at age 59 if you have a 30-pack-year history of smoking and currently smoke or have quit within the past 15 years.  Fecal occult blood test (FOBT) of the stool. You may have this test every year starting at age 65.  Flexible sigmoidoscopy or colonoscopy. You may have a sigmoidoscopy every 5 years or a colonoscopy every 10 years starting at age 19.  Hepatitis C blood test.  Hepatitis B blood test.  Sexually transmitted disease (STD) testing.  Diabetes screening. This is done by checking your blood sugar (glucose) after you have not eaten for a while (fasting). You may have this done every 1-3 years.  Bone density scan. This is done to screen for osteoporosis. You may have this done starting at age 6.  Mammogram. This may be done every 1-2 years. Talk to your health care provider about how often you should have regular mammograms. Talk  with your health care provider about your test results, treatment options, and if necessary, the need for more tests. Vaccines  Your health care provider may recommend certain vaccines, such as:  Influenza vaccine. This is recommended every year.   Tetanus, diphtheria, and acellular pertussis (Tdap, Td) vaccine. You may need a Td booster every 10 years.  Zoster vaccine. You may need this after age 71.  Pneumococcal 13-valent conjugate (PCV13) vaccine. One dose is recommended after age 60.  Pneumococcal polysaccharide (PPSV23) vaccine. One dose is recommended after age 76. Talk to your health care provider about which screenings and vaccines you need and how often you need them. This information is not intended to replace advice given to you by your health care provider. Make sure you discuss any questions you have with your health care provider. Document Released: 09/08/2015 Document Revised: 05/01/2016 Document Reviewed: 06/13/2015 Elsevier Interactive Patient Education  2017 [place] Prevention in the Home Falls can cause injuries. They can happen to people of all ages. There are many things you can do to make your home safe and to help prevent falls. What can I do on the outside of my home?  Regularly fix the edges of walkways and driveways and fix any cracks.  Remove anything that might make you trip as you walk through a door, such as a raised step or threshold.  Trim any bushes or trees on the path to your home.  Use bright outdoor lighting.  Clear any walking paths of anything that might make someone trip, such as rocks or tools.  Regularly check to see if handrails are loose or broken. Make sure that both sides of any steps have handrails.  Any raised decks and porches should have guardrails on the edges.  Have any leaves, snow, or ice cleared regularly.  Use sand or salt on walking paths during winter.  Clean up any spills in your garage right away. This includes oil or grease spills. What can I do in the bathroom?  Use night lights.  Install grab bars by the toilet and in the tub and shower. Do not use towel bars as grab bars.  Use non-skid mats or decals in the tub or shower.  If you need to sit  down in the shower, use a plastic, non-slip stool.  Keep the floor dry. Clean up any water that spills on the floor as soon as it happens.  Remove soap buildup in the tub or shower regularly.  Attach bath mats securely with double-sided non-slip rug tape.  Do not have throw rugs and other things on the floor that can make you trip. What can I do in the bedroom?  Use night lights.  Make sure that you have a light by your bed that is easy to reach.  Do not use any sheets or blankets that are too big for your bed. They should not hang down onto the floor.  Have a firm chair that has side arms. You can use this for support while you get dressed.  Do not have throw rugs and other things on the floor that can make you trip. What can I do in the kitchen?  Clean up any spills right away.  Avoid walking on wet floors.  Keep items that you use a lot in easy-to-reach places.  If you need to reach something above you, use a strong step stool that has a grab bar.  Keep electrical cords out of the way.  Do  not use floor polish or wax that makes floors slippery. If you must use wax, use non-skid floor wax.  Do not have throw rugs and other things on the floor that can make you trip. What can I do with my stairs?  Do not leave any items on the stairs.  Make sure that there are handrails on both sides of the stairs and use them. Fix handrails that are broken or loose. Make sure that handrails are as long as the stairways.  Check any carpeting to make sure that it is firmly attached to the stairs. Fix any carpet that is loose or worn.  Avoid having throw rugs at the top or bottom of the stairs. If you do have throw rugs, attach them to the floor with carpet tape.  Make sure that you have a light switch at the top of the stairs and the bottom of the stairs. If you do not have them, ask someone to add them for you. What else can I do to help prevent falls?  Wear shoes that:  Do not have  high heels.  Have rubber bottoms.  Are comfortable and fit you well.  Are closed at the toe. Do not wear sandals.  If you use a stepladder:  Make sure that it is fully opened. Do not climb a closed stepladder.  Make sure that both sides of the stepladder are locked into place.  Ask someone to hold it for you, if possible.  Clearly mark and make sure that you can see:  Any grab bars or handrails.  First and last steps.  Where the edge of each step is.  Use tools that help you move around (mobility aids) if they are needed. These include:  Canes.  Walkers.  Scooters.  Crutches.  Turn on the lights when you go into a dark area. Replace any light bulbs as soon as they burn out.  Set up your furniture so you have a clear path. Avoid moving your furniture around.  If any of your floors are uneven, fix them.  If there are any pets around you, be aware of where they are.  Review your medicines with your doctor. Some medicines can make you feel dizzy. This can increase your chance of falling. Ask your doctor what other things that you can do to help prevent falls. This information is not intended to replace advice given to you by your health care provider. Make sure you discuss any questions you have with your health care provider. Document Released: 06/08/2009 Document Revised: 01/18/2016 Document Reviewed: 09/16/2014 Elsevier Interactive Patient Education  2017 [employer].

## 2019-05-04 ENCOUNTER — Other Ambulatory Visit: Payer: Self-pay

## 2019-05-04 DIAGNOSIS — F419 Anxiety disorder, unspecified: Secondary | ICD-10-CM

## 2019-05-04 DIAGNOSIS — F329 Major depressive disorder, single episode, unspecified: Secondary | ICD-10-CM

## 2019-05-04 MED ORDER — CITALOPRAM HYDROBROMIDE 20 MG PO TABS: 20.0000 mg | ORAL_TABLET | Freq: Every day | ORAL | 0 refills | Status: DC

## 2019-06-03 ENCOUNTER — Ambulatory Visit (INDEPENDENT_AMBULATORY_CARE_PROVIDER_SITE_OTHER): Payer: Medicare HMO | Admitting: Family Medicine

## 2019-06-03 ENCOUNTER — Other Ambulatory Visit: Payer: Self-pay

## 2019-06-03 ENCOUNTER — Encounter: Payer: Self-pay | Admitting: Family Medicine

## 2019-06-03 VITALS — BP 120/70 | HR 72 | Ht 59.0 in | Wt 131.0 lb

## 2019-06-03 DIAGNOSIS — R1319 Other dysphagia: Secondary | ICD-10-CM

## 2019-06-03 DIAGNOSIS — F329 Major depressive disorder, single episode, unspecified: Secondary | ICD-10-CM

## 2019-06-03 DIAGNOSIS — Z1211 Encounter for screening for malignant neoplasm of colon: Secondary | ICD-10-CM | POA: Diagnosis not present

## 2019-06-03 DIAGNOSIS — Z23 Encounter for immunization: Secondary | ICD-10-CM

## 2019-06-03 DIAGNOSIS — F419 Anxiety disorder, unspecified: Secondary | ICD-10-CM

## 2019-06-03 DIAGNOSIS — F5101 Primary insomnia: Secondary | ICD-10-CM | POA: Diagnosis not present

## 2019-06-03 DIAGNOSIS — F331 Major depressive disorder, recurrent, moderate: Secondary | ICD-10-CM | POA: Diagnosis not present

## 2019-06-03 DIAGNOSIS — R1310 Dysphagia, unspecified: Secondary | ICD-10-CM | POA: Diagnosis not present

## 2019-06-03 MED ORDER — CITALOPRAM HYDROBROMIDE 20 MG PO TABS: 20.0000 mg | ORAL_TABLET | Freq: Every day | ORAL | 0 refills | Status: DC

## 2019-06-03 MED ORDER — AMITRIPTYLINE HCL 50 MG PO TABS: 50.0000 mg | ORAL_TABLET | Freq: Every day | ORAL | 3 refills | Status: DC

## 2019-06-03 MED ORDER — PANTOPRAZOLE SODIUM 40 MG PO TBEC: 40.0000 mg | DELAYED_RELEASE_TABLET | Freq: Every day | ORAL | 3 refills | Status: DC

## 2019-06-03 NOTE — Progress Notes (Signed)
Date:  06/03/2019   Name:  Michelle Willis   DOB:  [birth year]   MRN:  [id number]   Chief Complaint: Depression (PHQ9=0 and GAD7=0), Insomnia, and Dysphagia (trouble swallowing x 2 months- increase in heart burn)  Depression      (For dysphagia)  This is a chronic problem.  The current episode started more than 1 year ago.   The problem occurs rarely.  The problem has been gradually improving since onset.  Associated symptoms include insomnia.  Associated symptoms include no decreased concentration, no fatigue, no helplessness, no hopelessness, not irritable, no restlessness, no decreased interest, no appetite change, no body aches, no myalgias, no headaches, no indigestion, not sad and no suicidal ideas.  Past treatments include SSRIs - Selective serotonin reuptake inhibitors.  Compliance with treatment is good.  Previous treatment provided moderate relief. Insomnia Primary symptoms: no fragmented sleep, no sleep disturbance, difficulty falling asleep, no somnolence, no frequent awakening, no premature morning awakening, no napping.  The problem occurs intermittently. The treatment provided moderate relief. PMH includes: depression.  GI Problem Primary symptoms do not include fever, weight loss, fatigue, abdominal pain, nausea, vomiting, diarrhea, melena, hematemesis, jaundice, hematochezia, dysuria, myalgias, arthralgias or rash. Primary symptoms comment: for dysphagia.  The illness is also significant for dysphagia. The illness does not include chills, anorexia, odynophagia, bloating, constipation, tenesmus, back pain or itching. Associated medical issues do not include liver disease, alcohol abuse or PUD. Associated medical issues comments: previous esophageal stenosis.    Review of Systems  Constitutional: Negative for appetite change, chills, fatigue, fever and weight loss.  HENT: Negative for drooling, ear discharge, ear pain and sore throat.   Respiratory: Negative for cough, shortness  of breath and wheezing.   Cardiovascular: Negative for chest pain, palpitations and leg swelling.  Gastrointestinal: Positive for dysphagia. Negative for abdominal pain, anorexia, bloating, blood in stool, constipation, diarrhea, hematemesis, hematochezia, jaundice, melena, nausea and vomiting.  Endocrine: Negative for polydipsia.  Genitourinary: Negative for dysuria, frequency, hematuria and urgency.  Musculoskeletal: Negative for arthralgias, back pain, myalgias and neck pain.  Skin: Negative for itching and rash.  Allergic/Immunologic: Negative for environmental allergies.  Neurological: Negative for dizziness and headaches.  Hematological: Does not bruise/bleed easily.  Psychiatric/Behavioral: Positive for depression. Negative for decreased concentration, sleep disturbance and suicidal ideas. The patient has insomnia. The patient is not nervous/anxious.     Patient Active Problem List   Diagnosis Date Noted  . Primary insomnia 03/13/2017  . Essential hypertension 09/30/2016  . Anxiety and depression 09/30/2016    No Known Allergies  Past Surgical History:  Procedure Laterality Date  . ABDOMINAL HYSTERECTOMY    . CHOLECYSTECTOMY    . VAGINAL HYSTERECTOMY      Social History   Tobacco Use  . Smoking status: Never Smoker  . Smokeless tobacco: Never Used  Substance Use Topics  . Alcohol use: No    Alcohol/week: 0.0 standard drinks  . Drug use: No     Medication list has been reviewed and updated.  Current Meds  Medication Sig  . amitriptyline (ELAVIL) 50 MG tablet Take 1 tablet (50 mg total) by mouth at bedtime.  . Ascorbic Acid (VITAMIN C ADULT GUMMIES PO)   . Calcium Carbonate (CALCIUM 600 PO) Take 1 tablet by mouth daily.  . Cholecalciferol (VITAMIN D3 ADULT GUMMIES PO) Take 1 tablet by mouth daily.   . citalopram (CELEXA) 20 MG tablet Take 1 tablet (20 mg total) by mouth daily.    PHQ  2/9 Scores 06/03/2019 12/30/2018 08/05/2018 04/29/2018  PHQ - 2 Score 0 0 0 0   PHQ- 9 Score 0 0 0 0    BP Readings from Last 3 Encounters:  06/03/19 120/70  08/05/18 126/68  04/29/18 110/76    Physical Exam Constitutional:      General: She is not irritable.    Wt Readings from Last 3 Encounters:  06/03/19 131 lb (59.4 kg)  12/30/18 125 lb (56.7 kg)  08/05/18 131 lb (59.4 kg)    BP 120/70   Pulse 72   Ht 4\' 11"  (1.499 m)   Wt 131 lb (59.4 kg)   BMI 26.46 kg/m   Assessment and Plan: 1. Major depressive disorder, recurrent episode, moderate (HCC) PHQ 4 chronic.  Controlled.  Stable.  Continue citalopram 20 mg once a day. - citalopram (CELEXA) 20 MG tablet; Take 1 tablet (20 mg total) by mouth daily.  Dispense: 30 tablet; Refill: 0  2. Primary insomnia Chronic.  Controlled.  Stable.  Patient is having relatively good results with amitriptyline 50 mg nightly for initiation of sleep. - amitriptyline (ELAVIL) 50 MG tablet; Take 1 tablet (50 mg total) by mouth at bedtime.  Dispense: 90 tablet; Refill: 3  3. Esophageal dysphagia In the meantime patient is also experiencing dysphasia.  Patient had a previous procedure for esophageal stenosis but this was greater than 10 years ago and patient is uncertain as to who the physician that did the procedure.  We will initiate pantoprazole 40 mg once a day.  We will obtain a referral to gastroenterology for evaluation of dysphasia as well as patient is interested in having a colonoscopy.  Hemoccult cards were provided. - pantoprazole (PROTONIX) 40 MG tablet; Take 1 tablet (40 mg total) by mouth daily.  Dispense: 30 tablet; Refill: 3 - Ambulatory referral to Gastroenterology  4. Anxiety and depression Patient has had some anxiety in the past this is been controlled with Celexa 20 mg once a day and this will be continued. - citalopram (CELEXA) 20 MG tablet; Take 1 tablet (20 mg total) by mouth daily.  Dispense: 30 tablet; Refill: 0  5. Colon cancer screening This was discussed with patient and patient desires to  have referral to gastroenterology if she is to have procedure for dysphasia. - Ambulatory referral to Gastroenterology  6. Influenza vaccine needed Discussed and administered

## 2019-06-08 ENCOUNTER — Ambulatory Visit (INDEPENDENT_AMBULATORY_CARE_PROVIDER_SITE_OTHER): Payer: Medicare HMO

## 2019-06-08 DIAGNOSIS — Z1211 Encounter for screening for malignant neoplasm of colon: Secondary | ICD-10-CM | POA: Diagnosis not present

## 2019-06-08 DIAGNOSIS — R195 Other fecal abnormalities: Secondary | ICD-10-CM

## 2019-06-08 LAB — HEMOCCULT GUIAC POC 1CARD (OFFICE)
Card #2 Fecal Occult Blod, POC: NEGATIVE
Card #3 Fecal Occult Blood, POC: NEGATIVE
Fecal Occult Blood, POC: POSITIVE — AB

## 2019-06-08 NOTE — Progress Notes (Signed)
Ref to GI

## 2019-06-15 ENCOUNTER — Other Ambulatory Visit: Payer: Self-pay

## 2019-06-15 ENCOUNTER — Telehealth: Payer: Self-pay

## 2019-06-15 DIAGNOSIS — Z800 Family history of malignant neoplasm of digestive organs: Secondary | ICD-10-CM

## 2019-06-15 DIAGNOSIS — Z1211 Encounter for screening for malignant neoplasm of colon: Secondary | ICD-10-CM

## 2019-06-15 MED ORDER — NA SULFATE-K SULFATE-MG SULF 17.5-3.13-1.6 GM/177ML PO SOLN: 1.0000 | Freq: Once | ORAL | 0 refills | Status: AC

## 2019-06-15 NOTE — Telephone Encounter (Signed)
Gastroenterology Pre-Procedure Review  Request Date: Thursday 06/24/19 Requesting Physician: Dr. Allen Norris  PATIENT REVIEW QUESTIONS: The patient responded to the following health history questions as indicated:    1. Are you having any GI issues? yes (Abdominal Pain 3 weeks ago.  Offered an appt to discuss, declined but told her she can call back if she changes her mind.) 2. Do you have a personal history of Polyps? no 3. Do you have a family history of Colon Cancer or Polyps? yes (father colon cancer) 4. Diabetes Mellitus? no 5. Joint replacements in the past 12 months?no 6. Major health problems in the past 3 months?no 7. Any artificial heart valves, MVP, or defibrillator?no    MEDICATIONS & ALLERGIES:    Patient reports the following regarding taking any anticoagulation/antiplatelet therapy:   Plavix, Coumadin, Eliquis, Xarelto, Lovenox, Pradaxa, Brilinta, or Effient? no Aspirin? no  Patient confirms/reports the following medications:  Current Outpatient Medications  Medication Sig Dispense Refill  . amitriptyline (ELAVIL) 50 MG tablet Take 1 tablet (50 mg total) by mouth at bedtime. 90 tablet 3  . Ascorbic Acid (VITAMIN C ADULT GUMMIES PO)     . Calcium Carbonate (CALCIUM 600 PO) Take 1 tablet by mouth daily.    . Cholecalciferol (VITAMIN D3 ADULT GUMMIES PO) Take 1 tablet by mouth daily.     . citalopram (CELEXA) 20 MG tablet Take 1 tablet (20 mg total) by mouth daily. 30 tablet 0  . pantoprazole (PROTONIX) 40 MG tablet Take 1 tablet (40 mg total) by mouth daily. 30 tablet 3   No current facility-administered medications for this visit.     Patient confirms/reports the following allergies:  No Known Allergies  No orders of the defined types were placed in this encounter.   AUTHORIZATION INFORMATION Primary Insurance: 1D#: Group #:  Secondary Insurance: 1D#: Group #:  SCHEDULE INFORMATION: Date: Thursday 06/24/19 Time: Location:[facility]

## 2019-06-15 NOTE — Telephone Encounter (Signed)
Returned patients call to schedule colonoscopy.  LVM for her to call me back.  Thanks [employer]

## 2019-06-16 ENCOUNTER — Other Ambulatory Visit: Payer: Self-pay

## 2019-06-16 ENCOUNTER — Encounter: Payer: Self-pay | Admitting: *Deleted

## 2019-06-21 ENCOUNTER — Other Ambulatory Visit
Admission: RE | Admit: 2019-06-21 | Discharge: 2019-06-21 | Disposition: A | Discharge status: Home / Self Care | Payer: Medicare HMO | Source: Ambulatory Visit | Attending: Gastroenterology | Admitting: Gastroenterology

## 2019-06-21 ENCOUNTER — Other Ambulatory Visit: Payer: Self-pay

## 2019-06-21 DIAGNOSIS — Z20828 Contact with and (suspected) exposure to other viral communicable diseases: Secondary | ICD-10-CM | POA: Diagnosis not present

## 2019-06-21 DIAGNOSIS — Z01812 Encounter for preprocedural laboratory examination: Secondary | ICD-10-CM | POA: Diagnosis not present

## 2019-06-21 LAB — SARS CORONAVIRUS 2 (TAT 6-24 HRS): SARS Coronavirus 2: NEGATIVE

## 2019-06-23 NOTE — Discharge Instructions (Signed)

## 2019-06-24 ENCOUNTER — Ambulatory Visit
Admission: RE | Admit: 2019-06-24 | Discharge: 2019-06-24 | Disposition: A | Discharge status: Home / Self Care | Payer: Medicare HMO | Attending: Gastroenterology | Admitting: Gastroenterology

## 2019-06-24 ENCOUNTER — Ambulatory Visit: Payer: Medicare HMO | Admitting: Anesthesiology

## 2019-06-24 ENCOUNTER — Encounter
Admission: RE | Disposition: A | Discharge status: Home / Self Care | Payer: Self-pay | Source: Home / Self Care | Attending: Gastroenterology

## 2019-06-24 ENCOUNTER — Other Ambulatory Visit: Payer: Self-pay

## 2019-06-24 DIAGNOSIS — K648 Other hemorrhoids: Secondary | ICD-10-CM | POA: Diagnosis not present

## 2019-06-24 DIAGNOSIS — F329 Major depressive disorder, single episode, unspecified: Secondary | ICD-10-CM | POA: Diagnosis not present

## 2019-06-24 DIAGNOSIS — K5730 Diverticulosis of large intestine without perforation or abscess without bleeding: Secondary | ICD-10-CM | POA: Diagnosis not present

## 2019-06-24 DIAGNOSIS — K219 Gastro-esophageal reflux disease without esophagitis: Secondary | ICD-10-CM | POA: Insufficient documentation

## 2019-06-24 DIAGNOSIS — Z800 Family history of malignant neoplasm of digestive organs: Secondary | ICD-10-CM

## 2019-06-24 DIAGNOSIS — Z79899 Other long term (current) drug therapy: Secondary | ICD-10-CM | POA: Diagnosis not present

## 2019-06-24 DIAGNOSIS — F419 Anxiety disorder, unspecified: Secondary | ICD-10-CM | POA: Diagnosis not present

## 2019-06-24 DIAGNOSIS — Z1211 Encounter for screening for malignant neoplasm of colon: Secondary | ICD-10-CM

## 2019-06-24 HISTORY — PX: COLONOSCOPY WITH PROPOFOL: SHX5780

## 2019-06-24 HISTORY — DX: Gastro-esophageal reflux disease without esophagitis: K21.9

## 2019-06-24 SURGERY — COLONOSCOPY WITH PROPOFOL
Anesthesia: General | Site: Rectum

## 2019-06-24 MED ORDER — PROPOFOL 10 MG/ML IV BOLUS
INTRAVENOUS | Status: DC | PRN
  Administered 2019-06-24: 70 mg via INTRAVENOUS
  Administered 2019-06-24 (×3): 30 mg via INTRAVENOUS
  Administered 2019-06-24 (×2): 20 mg via INTRAVENOUS
  Administered 2019-06-24: 30 mg via INTRAVENOUS
  Administered 2019-06-24: 10 mg via INTRAVENOUS
  Administered 2019-06-24 (×3): 20 mg via INTRAVENOUS
  Administered 2019-06-24: 30 mg via INTRAVENOUS
  Administered 2019-06-24 (×2): 20 mg via INTRAVENOUS
  Administered 2019-06-24: 30 mg via INTRAVENOUS
  Administered 2019-06-24 (×2): 20 mg via INTRAVENOUS

## 2019-06-24 MED ORDER — ONDANSETRON HCL 4 MG/2ML IJ SOLN: 4.0000 mg | Freq: Once | INTRAMUSCULAR | Status: DC | PRN

## 2019-06-24 MED ORDER — LIDOCAINE HCL (CARDIAC) PF 100 MG/5ML IV SOSY
PREFILLED_SYRINGE | INTRAVENOUS | Status: DC | PRN
  Administered 2019-06-24: 50 mg via INTRAVENOUS

## 2019-06-24 MED ORDER — ACETAMINOPHEN 325 MG PO TABS: 325.0000 mg | ORAL_TABLET | ORAL | Status: DC | PRN

## 2019-06-24 MED ORDER — SODIUM CHLORIDE 0.9 % IV SOLN: INTRAVENOUS | Status: DC

## 2019-06-24 MED ORDER — LACTATED RINGERS IV SOLN
INTRAVENOUS | Status: DC
  Administered 2019-06-24: 08:00:00 via INTRAVENOUS

## 2019-06-24 MED ORDER — STERILE WATER FOR IRRIGATION IR SOLN
Status: DC | PRN
  Administered 2019-06-24: 150 mL

## 2019-06-24 MED ORDER — ACETAMINOPHEN 160 MG/5ML PO SOLN: 325.0000 mg | ORAL | Status: DC | PRN

## 2019-06-24 SURGICAL SUPPLY — 5 items
CANISTER SUCT 1200ML W/VALVE (MISCELLANEOUS) ×3 IMPLANT
GOWN CVR UNV OPN BCK APRN NK (MISCELLANEOUS) ×2 IMPLANT
GOWN ISOL THUMB LOOP REG UNIV (MISCELLANEOUS) ×4
KIT ENDO PROCEDURE OLY (KITS) ×3 IMPLANT
WATER STERILE IRR 250ML POUR (IV SOLUTION) ×3 IMPLANT

## 2019-06-24 NOTE — Transfer of Care (Signed)
Immediate Anesthesia Transfer of Care Note  Patient: Michelle Willis  Procedure(s) Performed: COLONOSCOPY WITH PROPOFOL (N/A Rectum)  Patient Location: PACU  Anesthesia Type: General  Level of Consciousness: awake, alert  and patient cooperative  Airway and Oxygen Therapy: Patient Spontanous Breathing and Patient connected to supplemental oxygen  Post-op Assessment: Post-op Vital signs reviewed, Patient's Cardiovascular Status Stable, Respiratory Function Stable, Patent Airway and No signs of Nausea or vomiting  Post-op Vital Signs: Reviewed and stable  Complications: No apparent anesthesia complications

## 2019-06-24 NOTE — Op Note (Signed)
[facility] Gastroenterology Patient Name: Michelle Willis Procedure Date: 06/24/2019 9:12 AM MRN: [id number] Account #: [id number] Date of Birth: [birth year] Admit Type: Outpatient Age: 68 Room: [facility] OR ROOM 01 Gender: Female Note Status: Finalized Procedure:            Colonoscopy Indications:          Screening for colorectal malignant neoplasm Providers:            Lucilla Lame MD, MD Referring MD:         Juline Patch, MD (Referring MD) Medicines:            Propofol per Anesthesia Complications:        No immediate complications. Procedure:            Pre-Anesthesia Assessment:                       - Prior to the procedure, a History and Physical was                        performed, and patient medications and allergies were                        reviewed. The patient's tolerance of previous                        anesthesia was also reviewed. The risks and benefits of                        the procedure and the sedation options and risks were                        discussed with the patient. All questions were                        answered, and informed consent was obtained. Prior                        Anticoagulants: The patient has taken no previous                        anticoagulant or antiplatelet agents. ASA Grade                        Assessment: II - A patient with mild systemic disease.                        After reviewing the risks and benefits, the patient was                        deemed in satisfactory condition to undergo the                        procedure.                       After obtaining informed consent, the colonoscope was                        passed under direct vision. Throughout the procedure,  the patient's blood pressure, pulse, and oxygen                        saturations were monitored continuously. The                        Colonoscope was introduced through the anus and          advanced to the the cecum, identified by appendiceal                        orifice and ileocecal valve. The colonoscopy was                        performed without difficulty. The patient tolerated the                        procedure well. The quality of the bowel preparation                        was fair. Findings:      The perianal and digital rectal examinations were normal.      Multiple small-mouthed diverticula were found in the sigmoid colon.      Non-bleeding internal hemorrhoids were found during retroflexion. The       hemorrhoids were Grade II (internal hemorrhoids that prolapse but reduce       spontaneously). Impression:           - Preparation of the colon was fair.                       - Diverticulosis in the sigmoid colon.                       - Non-bleeding internal hemorrhoids.                       - No specimens collected. Recommendation:       - Discharge patient to home.                       - Resume previous diet.                       - Continue present medications. Procedure Code(s):    --- Professional ---                       [phone], Colonoscopy, flexible; diagnostic, including                        collection of specimen(s) by brushing or washing, when                        performed (separate procedure) Diagnosis Code(s):    --- Professional ---                       Z12.11, Encounter for screening for malignant neoplasm                        of colon CPT copyright 2019 American Medical Association. All rights reserved. The codes documented in this report are preliminary and upon coder review may  be revised to meet current compliance requirements. Lucilla Lame MD, MD 06/24/2019 9:46:04 AM This report has been signed electronically. Number of Addenda: 0 Note Initiated On: 06/24/2019 9:12 AM Scope Withdrawal Time: 0 hours 12 minutes 10 seconds  Total Procedure Duration: 0 hours 25 minutes 28 seconds  Estimated Blood Loss: Estimated blood  loss: none.      [facility]

## 2019-06-24 NOTE — Anesthesia Postprocedure Evaluation (Signed)
Anesthesia Post Note  Patient: Michelle Willis  Procedure(s) Performed: COLONOSCOPY WITH PROPOFOL (N/A Rectum)  Patient location during evaluation: PACU Anesthesia Type: General Level of consciousness: awake and alert Pain management: pain level controlled Vital Signs Assessment: post-procedure vital signs reviewed and stable Respiratory status: spontaneous breathing, nonlabored ventilation, respiratory function stable and patient connected to nasal cannula oxygen Cardiovascular status: blood pressure returned to baseline and stable Postop Assessment: no apparent nausea or vomiting Anesthetic complications: no    Trecia Rogers

## 2019-06-24 NOTE — Anesthesia Preprocedure Evaluation (Signed)
Anesthesia Evaluation  Patient identified by MRN, date of birth, ID band Patient awake    Reviewed: Allergy & Precautions, H&P , NPO status , Patient's Chart, lab work & pertinent test results, reviewed documented beta blocker date and time   Airway Mallampati: II  TM Distance: >3 FB Neck ROM: full    Dental no notable dental hx.    Pulmonary neg pulmonary ROS,    Pulmonary exam normal breath sounds clear to auscultation       Cardiovascular Exercise Tolerance: Good negative cardio ROS Normal cardiovascular exam Rhythm:regular Rate:Normal     Neuro/Psych negative neurological ROS  negative psych ROS   GI/Hepatic Neg liver ROS, GERD  Medicated and Controlled,  Endo/Other  negative endocrine ROS  Renal/GU negative Renal ROS  negative genitourinary   Musculoskeletal   Abdominal   Peds  Hematology negative hematology ROS (+)   Anesthesia Other Findings   Reproductive/Obstetrics negative OB ROS                             Anesthesia Physical Anesthesia Plan  ASA: II  Anesthesia Plan: General   Post-op Pain Management:    Induction:   PONV Risk Score and Plan: Propofol infusion  Airway Management Planned:   Additional Equipment:   Intra-op Plan:   Post-operative Plan:   Informed Consent: I have reviewed the patients History and Physical, chart, labs and discussed the procedure including the risks, benefits and alternatives for the proposed anesthesia with the patient or authorized representative who has indicated his/her understanding and acceptance.     Dental Advisory Given  Plan Discussed with: CRNA  Anesthesia Plan Comments:         Anesthesia Quick Evaluation

## 2019-06-24 NOTE — H&P (Signed)
Lucilla Lame, MD [address] [place], [place] [address] Phone: [phone] Fax : [phone]  Primary Care Physician:  Juline Patch, MD Primary Gastroenterologist:  Dr. Allen Norris  Pre-Procedure History & Physical: HPI:  Michelle Willis is a 68 y.o. female is here for a screening colonoscopy.   Past Medical History:  Diagnosis Date  . Anxiety   . Cancer ([place]) 1991   basal cell ca  . Depression   . GERD (gastroesophageal reflux disease)     Past Surgical History:  Procedure Laterality Date  . ABDOMINAL HYSTERECTOMY    . CHOLECYSTECTOMY    . VAGINAL HYSTERECTOMY      Prior to Admission medications   Medication Sig Start Date End Date Taking? Authorizing Provider  amitriptyline (ELAVIL) 50 MG tablet Take 1 tablet (50 mg total) by mouth at bedtime. 06/03/19  Yes Juline Patch, MD  Ascorbic Acid (VITAMIN C ADULT GUMMIES PO)  08/26/16  Yes [provider]  Calcium Carbonate (CALCIUM 600 PO) Take 1 tablet by mouth daily.   Yes [provider]  Cholecalciferol (VITAMIN D3 ADULT GUMMIES PO) Take 1 tablet by mouth daily.  08/26/16  Yes [provider]  citalopram (CELEXA) 20 MG tablet Take 1 tablet (20 mg total) by mouth daily. 06/03/19  Yes Juline Patch, MD  pantoprazole (PROTONIX) 40 MG tablet Take 1 tablet (40 mg total) by mouth daily. 06/03/19  Yes Juline Patch, MD    Allergies as of 06/15/2019  . (No Known Allergies)    Family History  Problem Relation Age of Onset  . Cancer Father   . Breast cancer Neg Hx     Social History   Socioeconomic History  . Marital status: Divorced    Spouse name: Not on file  . Number of children: 3  . Years of education: some college  . Highest education level: Some college, no degree  Occupational History  . Occupation: Retired  Scientific laboratory technician  . Financial resource strain: Not hard at all  . Food insecurity    Worry: Never true    Inability: Never true  . Transportation needs   Medical: No    Non-medical: No  Tobacco Use  . Smoking status: Never Smoker  . Smokeless tobacco: Never Used  Substance and Sexual Activity  . Alcohol use: No    Alcohol/week: 0.0 standard drinks  . Drug use: No  . Sexual activity: Not Currently  Lifestyle  . Physical activity    Days per week: 5 days    Minutes per session: 60 min  . Stress: Not at all  Relationships  . Social connections    Talks on phone: More than three times a week    Gets together: More than three times a week    Attends religious service: Never    Active member of club or organization: No    Attends meetings of clubs or organizations: Never    Relationship status: Divorced  . Intimate partner violence    Fear of current or ex partner: No    Emotionally abused: No    Physically abused: No    Forced sexual activity: No  Other Topics Concern  . Not on file  Social History Narrative  . Not on file    Review of Systems: See HPI, otherwise negative ROS  Physical Exam: BP (!) 145/81   Pulse 95   Temp 98.4 F (36.9 C) (Temporal)   Ht 4\' 11"  (1.499 m)  Wt 59.4 kg   SpO2 100%   BMI 26.46 kg/m  General:   Alert,  pleasant and cooperative in NAD Head:  Normocephalic and atraumatic. Neck:  Supple; no masses or thyromegaly. Lungs:  Clear throughout to auscultation.    Heart:  Regular rate and rhythm. Abdomen:  Soft, nontender and nondistended. Normal bowel sounds, without guarding, and without rebound.   Neurologic:  Alert and  oriented x4;  grossly normal neurologically.  Impression/Plan: Michelle Willis is now here to undergo a screening colonoscopy.  Risks, benefits, and alternatives regarding colonoscopy have been reviewed with the patient.  Questions have been answered.  All parties agreeable.

## 2019-06-24 NOTE — Anesthesia Procedure Notes (Signed)
Performed by: Demetric Parslow, CRNA Pre-anesthesia Checklist: Patient identified, Emergency Drugs available, Suction available, Timeout performed and Patient being monitored Patient Re-evaluated:Patient Re-evaluated prior to induction Oxygen Delivery Method: Nasal cannula Placement Confirmation: positive ETCO2       

## 2019-06-25 ENCOUNTER — Encounter: Payer: Self-pay | Admitting: Gastroenterology

## 2019-07-07 ENCOUNTER — Other Ambulatory Visit: Payer: Self-pay

## 2019-07-07 ENCOUNTER — Ambulatory Visit (INDEPENDENT_AMBULATORY_CARE_PROVIDER_SITE_OTHER): Payer: Medicare HMO | Admitting: Gastroenterology

## 2019-07-07 ENCOUNTER — Encounter: Payer: Self-pay | Admitting: Gastroenterology

## 2019-07-07 VITALS — BP 119/72 | HR 97 | Temp 98.4°F | Ht 59.0 in | Wt 132.2 lb

## 2019-07-07 DIAGNOSIS — R1310 Dysphagia, unspecified: Secondary | ICD-10-CM | POA: Diagnosis not present

## 2019-07-07 NOTE — H&P (View-Only) (Signed)
Primary Care Physician: Michelle Patch, MD  Primary Gastroenterologist:  Dr. Lucilla Lame  No chief complaint on file.   HPI: Michelle Willis is a 68 y.o. female here with a report of dysphagia.  The patient was referred for dysphagia and a screening colonoscopy.  The patient had her screening colonoscopy at the end of December last month and at that time did not have any abnormal findings except for not having a completely clean prep.  The patient is now here for evaluation of her dysphagia. The patient reports that her problems with swallowing happen about 2 times a month.  She reports that she was put on Protonix by her primary care provider, Dr. Ronnald Willis, and states that it has helped her symptoms.  Her dysphagia is not only when food comes up but also when she tries to eat she states that she feels like food goes down the wrong pipe.  Current Outpatient Medications  Medication Sig Dispense Refill  . amitriptyline (ELAVIL) 50 MG tablet Take 1 tablet (50 mg total) by mouth at bedtime. 90 tablet 3  . Ascorbic Acid (VITAMIN C ADULT GUMMIES PO)     . Calcium Carbonate (CALCIUM 600 PO) Take 1 tablet by mouth daily.    . Cholecalciferol (VITAMIN D3 ADULT GUMMIES PO) Take 1 tablet by mouth daily.     . citalopram (CELEXA) 20 MG tablet Take 1 tablet (20 mg total) by mouth daily. 30 tablet 0  . pantoprazole (PROTONIX) 40 MG tablet Take 1 tablet (40 mg total) by mouth daily. 30 tablet 3   No current facility-administered medications for this visit.     Allergies as of 07/07/2019  . (No Known Allergies)    ROS:  General: Negative for anorexia, weight loss, fever, chills, fatigue, weakness. ENT: Negative for hoarseness, difficulty swallowing , nasal congestion. CV: Negative for chest pain, angina, palpitations, dyspnea on exertion, peripheral edema.  Respiratory: Negative for dyspnea at rest, dyspnea on exertion, cough, sputum, wheezing.  GI: See history of present illness. GU:  Negative  for dysuria, hematuria, urinary incontinence, urinary frequency, nocturnal urination.  Endo: Negative for unusual weight change.    Physical Examination:   There were no vitals taken for this visit.  General: Well-nourished, well-developed in no acute distress.  Eyes: No icterus. Conjunctivae pink. Lungs: Clear to auscultation bilaterally. Non-labored. Heart: Regular rate and rhythm, no murmurs rubs or gallops.  Abdomen: Bowel sounds are normal, nontender, nondistended, no hepatosplenomegaly or masses, no abdominal bruits or hernia , no rebound or guarding.   Extremities: No lower extremity edema. No clubbing or deformities. Neuro: Alert and oriented x 3.  Grossly intact. Skin: Warm and dry, no jaundice.   Psych: Alert and cooperative, normal mood and affect.  Labs:    Imaging Studies: No results found.  Assessment and Plan:   Michelle Willis is a 68 y.o. y/o female who comes in with some dysphagia.  The patient has not had any unexplained weight loss fevers chills nausea or vomiting.  The patient has problems when she regurgitates and also when she eats.  The patient is feeling better on Protonix.  The patient will be set up for an EGD to rule out any hiatal hernia/esophagitis/strictures or narrowing. I have discussed risks & benefits which include, but are not limited to, bleeding, infection, perforation & drug reaction.  The patient agrees with this plan & written consent will be obtained.        Lucilla Lame, MD. Marval Regal  Note: This dictation was prepared with Dragon dictation along with smaller phrase technology. Any transcriptional errors that result from this process are unintentional.

## 2019-07-07 NOTE — Progress Notes (Signed)
Primary Care Physician: Juline Patch, MD  Primary Gastroenterologist:  Dr. Lucilla Lame  No chief complaint on file.   HPI: Michelle Willis is a 68 y.o. female here with a report of dysphagia.  The patient was referred for dysphagia and a screening colonoscopy.  The patient had her screening colonoscopy at the end of December last month and at that time did not have any abnormal findings except for not having a completely clean prep.  The patient is now here for evaluation of her dysphagia. The patient reports that her problems with swallowing happen about 2 times a month.  She reports that she was put on Protonix by her primary care provider, Dr. Ronnald Ramp, and states that it has helped her symptoms.  Her dysphagia is not only when food comes up but also when she tries to eat she states that she feels like food goes down the wrong pipe.  Current Outpatient Medications  Medication Sig Dispense Refill  . amitriptyline (ELAVIL) 50 MG tablet Take 1 tablet (50 mg total) by mouth at bedtime. 90 tablet 3  . Ascorbic Acid (VITAMIN C ADULT GUMMIES PO)     . Calcium Carbonate (CALCIUM 600 PO) Take 1 tablet by mouth daily.    . Cholecalciferol (VITAMIN D3 ADULT GUMMIES PO) Take 1 tablet by mouth daily.     . citalopram (CELEXA) 20 MG tablet Take 1 tablet (20 mg total) by mouth daily. 30 tablet 0  . pantoprazole (PROTONIX) 40 MG tablet Take 1 tablet (40 mg total) by mouth daily. 30 tablet 3   No current facility-administered medications for this visit.     Allergies as of 07/07/2019  . (No Known Allergies)    ROS:  General: Negative for anorexia, weight loss, fever, chills, fatigue, weakness. ENT: Negative for hoarseness, difficulty swallowing , nasal congestion. CV: Negative for chest pain, angina, palpitations, dyspnea on exertion, peripheral edema.  Respiratory: Negative for dyspnea at rest, dyspnea on exertion, cough, sputum, wheezing.  GI: See history of present illness. GU:  Negative  for dysuria, hematuria, urinary incontinence, urinary frequency, nocturnal urination.  Endo: Negative for unusual weight change.    Physical Examination:   There were no vitals taken for this visit.  General: Well-nourished, well-developed in no acute distress.  Eyes: No icterus. Conjunctivae pink. Lungs: Clear to auscultation bilaterally. Non-labored. Heart: Regular rate and rhythm, no murmurs rubs or gallops.  Abdomen: Bowel sounds are normal, nontender, nondistended, no hepatosplenomegaly or masses, no abdominal bruits or hernia , no rebound or guarding.   Extremities: No lower extremity edema. No clubbing or deformities. Neuro: Alert and oriented x 3.  Grossly intact. Skin: Warm and dry, no jaundice.   Psych: Alert and cooperative, normal mood and affect.  Labs:    Imaging Studies: No results found.  Assessment and Plan:   Michelle Willis is a 68 y.o. y/o female who comes in with some dysphagia.  The patient has not had any unexplained weight loss fevers chills nausea or vomiting.  The patient has problems when she regurgitates and also when she eats.  The patient is feeling better on Protonix.  The patient will be set up for an EGD to rule out any hiatal hernia/esophagitis/strictures or narrowing. I have discussed risks & benefits which include, but are not limited to, bleeding, infection, perforation & drug reaction.  The patient agrees with this plan & written consent will be obtained.        Lucilla Lame, MD. Marval Regal  Note: This dictation was prepared with Dragon dictation along with smaller phrase technology. Any transcriptional errors that result from this process are unintentional.

## 2019-07-12 ENCOUNTER — Encounter: Payer: Self-pay | Admitting: *Deleted

## 2019-07-12 ENCOUNTER — Other Ambulatory Visit: Payer: Self-pay

## 2019-07-13 ENCOUNTER — Other Ambulatory Visit
Admission: RE | Admit: 2019-07-13 | Discharge: 2019-07-13 | Disposition: A | Discharge status: Home / Self Care | Payer: Medicare HMO | Source: Ambulatory Visit | Attending: Gastroenterology | Admitting: Gastroenterology

## 2019-07-13 DIAGNOSIS — Z01812 Encounter for preprocedural laboratory examination: Secondary | ICD-10-CM | POA: Insufficient documentation

## 2019-07-13 DIAGNOSIS — Z20828 Contact with and (suspected) exposure to other viral communicable diseases: Secondary | ICD-10-CM | POA: Insufficient documentation

## 2019-07-13 LAB — SARS CORONAVIRUS 2 (TAT 6-24 HRS): SARS Coronavirus 2: NEGATIVE

## 2019-07-15 NOTE — Anesthesia Preprocedure Evaluation (Addendum)
Anesthesia Evaluation  Patient identified by MRN, date of birth, ID band Patient awake    Reviewed: Allergy & Precautions, NPO status , Patient's Chart, lab work & pertinent test results  History of Anesthesia Complications Negative for: history of anesthetic complications  Airway Mallampati: II  TM Distance: >3 FB Neck ROM: Full    Dental   Pulmonary    breath sounds clear to auscultation       Cardiovascular hypertension, (-) angina(-) DOE  Rhythm:Regular Rate:Normal     Neuro/Psych PSYCHIATRIC DISORDERS Anxiety Depression    GI/Hepatic GERD  ,  Endo/Other    Renal/GU      Musculoskeletal   Abdominal   Peds  Hematology   Anesthesia Other Findings Skin cancer  Reproductive/Obstetrics                            Anesthesia Physical Anesthesia Plan  ASA: II  Anesthesia Plan: General   Post-op Pain Management:    Induction: Intravenous  PONV Risk Score and Plan: 2 and Propofol infusion, TIVA and Treatment may vary due to age or medical condition  Airway Management Planned: Natural Airway and Nasal Cannula  Additional Equipment:   Intra-op Plan:   Post-operative Plan:   Informed Consent: I have reviewed the patients History and Physical, chart, labs and discussed the procedure including the risks, benefits and alternatives for the proposed anesthesia with the patient or authorized representative who has indicated his/her understanding and acceptance.       Plan Discussed with: CRNA and Anesthesiologist  Anesthesia Plan Comments:        Anesthesia Quick Evaluation

## 2019-07-15 NOTE — Discharge Instructions (Signed)

## 2019-07-16 ENCOUNTER — Ambulatory Visit: Payer: Medicare HMO | Admitting: Anesthesiology

## 2019-07-16 ENCOUNTER — Ambulatory Visit
Admission: RE | Admit: 2019-07-16 | Discharge: 2019-07-16 | Disposition: A | Discharge status: Home / Self Care | Payer: Medicare HMO | Attending: Gastroenterology | Admitting: Gastroenterology

## 2019-07-16 ENCOUNTER — Encounter
Admission: RE | Disposition: A | Discharge status: Home / Self Care | Payer: Self-pay | Source: Home / Self Care | Attending: Gastroenterology

## 2019-07-16 ENCOUNTER — Other Ambulatory Visit: Payer: Self-pay

## 2019-07-16 DIAGNOSIS — Z79899 Other long term (current) drug therapy: Secondary | ICD-10-CM | POA: Insufficient documentation

## 2019-07-16 DIAGNOSIS — F419 Anxiety disorder, unspecified: Secondary | ICD-10-CM | POA: Insufficient documentation

## 2019-07-16 DIAGNOSIS — R1319 Other dysphagia: Secondary | ICD-10-CM | POA: Diagnosis not present

## 2019-07-16 DIAGNOSIS — I10 Essential (primary) hypertension: Secondary | ICD-10-CM | POA: Insufficient documentation

## 2019-07-16 DIAGNOSIS — R1310 Dysphagia, unspecified: Secondary | ICD-10-CM

## 2019-07-16 DIAGNOSIS — K222 Esophageal obstruction: Secondary | ICD-10-CM

## 2019-07-16 DIAGNOSIS — K219 Gastro-esophageal reflux disease without esophagitis: Secondary | ICD-10-CM | POA: Diagnosis not present

## 2019-07-16 DIAGNOSIS — F329 Major depressive disorder, single episode, unspecified: Secondary | ICD-10-CM | POA: Insufficient documentation

## 2019-07-16 DIAGNOSIS — K449 Diaphragmatic hernia without obstruction or gangrene: Secondary | ICD-10-CM | POA: Insufficient documentation

## 2019-07-16 HISTORY — PX: ESOPHAGOGASTRODUODENOSCOPY (EGD) WITH PROPOFOL: SHX5813

## 2019-07-16 SURGERY — ESOPHAGOGASTRODUODENOSCOPY (EGD) WITH PROPOFOL
Anesthesia: General | Site: Mouth

## 2019-07-16 MED ORDER — LIDOCAINE HCL (CARDIAC) PF 100 MG/5ML IV SOSY
PREFILLED_SYRINGE | INTRAVENOUS | Status: DC | PRN
  Administered 2019-07-16: 40 mg via INTRAVENOUS

## 2019-07-16 MED ORDER — ACETAMINOPHEN 10 MG/ML IV SOLN: 1000.0000 mg | Freq: Once | INTRAVENOUS | Status: DC | PRN

## 2019-07-16 MED ORDER — ONDANSETRON HCL 4 MG/2ML IJ SOLN: 4.0000 mg | Freq: Once | INTRAMUSCULAR | Status: DC | PRN

## 2019-07-16 MED ORDER — PROPOFOL 10 MG/ML IV BOLUS
INTRAVENOUS | Status: DC | PRN
  Administered 2019-07-16: 100 mg via INTRAVENOUS

## 2019-07-16 MED ORDER — GLYCOPYRROLATE 0.2 MG/ML IJ SOLN
INTRAMUSCULAR | Status: DC | PRN
  Administered 2019-07-16: 0.2 mg via INTRAVENOUS

## 2019-07-16 MED ORDER — STERILE WATER FOR IRRIGATION IR SOLN
Status: DC | PRN
  Administered 2019-07-16: 09:00:00

## 2019-07-16 MED ORDER — LACTATED RINGERS IV SOLN
100.0000 mL/h | INTRAVENOUS | Status: DC
  Administered 2019-07-16: 100 mL/h via INTRAVENOUS

## 2019-07-16 SURGICAL SUPPLY — 10 items
BALLN DILATOR 15-18 8 (BALLOONS) ×3
BALLOON DILATOR 15-18 8 (BALLOONS) IMPLANT
BLOCK BITE 60FR ADLT L/F GRN (MISCELLANEOUS) ×3 IMPLANT
CANISTER SUCT 1200ML W/VALVE (MISCELLANEOUS) ×3 IMPLANT
FORCEPS BIOP RAD 4 LRG CAP 4 (CUTTING FORCEPS) ×2 IMPLANT
GOWN CVR UNV OPN BCK APRN NK (MISCELLANEOUS) ×2 IMPLANT
GOWN ISOL THUMB LOOP REG UNIV (MISCELLANEOUS) ×4
KIT ENDO PROCEDURE OLY (KITS) ×3 IMPLANT
SYR INFLATION 60ML (SYRINGE) ×2 IMPLANT
WATER STERILE IRR 250ML POUR (IV SOLUTION) ×3 IMPLANT

## 2019-07-16 NOTE — Op Note (Signed)
[facility] Patient Name: Michelle Willis Procedure Date: 07/16/2019 8:24 AM MRN: [id number] Account #: [id number] Date of Birth: Jun 19, 1951 Admit Type: Outpatient Age: 68 Room: [facility] OR ROOM 01 Gender: Female Note Status: Finalized Procedure:             Upper GI endoscopy Indications:           Dysphagia Providers:             Lucilla Lame MD, MD Referring MD:          Juline Patch, MD (Referring MD) Medicines:             Propofol per Anesthesia Complications:         No immediate complications. Procedure:             Pre-Anesthesia Assessment:                        - Prior to the procedure, a History and Physical was                         performed, and patient medications and allergies were                         reviewed. The patient's tolerance of previous                         anesthesia was also reviewed. The risks and benefits                         of the procedure and the sedation options and risks                         were discussed with the patient. All questions were                         answered, and informed consent was obtained. Prior                         Anticoagulants: The patient has taken no previous                         anticoagulant or antiplatelet agents. ASA Grade                         Assessment: II - A patient with mild systemic disease.                         After reviewing the risks and benefits, the patient                         was deemed in satisfactory condition to undergo the                         procedure.                        After obtaining informed consent, the endoscope was  passed under direct vision. Throughout the procedure,                         the patient's blood pressure, pulse, and oxygen                         saturations were monitored continuously. The                         Endosonoscope was introduced through the mouth, and           advanced to the second part of duodenum. The upper GI                         endoscopy was accomplished without difficulty. The                         patient tolerated the procedure well. Findings:      A small hiatal hernia was present.      One benign-appearing, intrinsic mild stenosis was found at the       gastroesophageal junction. The stenosis was traversed. A TTS dilator was       passed through the scope. Dilation with a 15-16.5-18 mm balloon dilator       was performed to 16.5 mm. The dilation site was examined following       endoscope reinsertion and showed moderate improvement in luminal       narrowing.      The stomach was normal.      A scar was found in the duodenal bulb.      One biopsy was obtained with cold forceps for histology in the middle       third of the esophagus. Impression:            - Small hiatal hernia.                        - Benign-appearing esophageal stenosis. Dilated.                        - Normal stomach.                        - Duodenal scar.                        - Biopsy performed in the middle third of the                         esophagus. Recommendation:        - Discharge patient to home.                        - Resume previous diet.                        - Continue present medications.                        - Await pathology results. Procedure Code(s):     --- Professional ---  [phone], Esophagogastroduodenoscopy, flexible,                         transoral; with transendoscopic balloon dilation of                         esophagus (less than 30 mm diameter)                        43239, 59, Esophagogastroduodenoscopy, flexible,                         transoral; with biopsy, single or multiple Diagnosis Code(s):     --- Professional ---                        R13.10, Dysphagia, unspecified                        K22.2, Esophageal obstruction CPT copyright 2019 [employer]. All  rights reserved. The codes documented in this report are preliminary and upon coder review may  be revised to meet current compliance requirements. Lucilla Lame MD, MD 07/16/2019 8:40:02 AM This report has been signed electronically. Number of Addenda: 0 Note Initiated On: 07/16/2019 8:24 AM Total Procedure Duration: 0 hours 6 minutes 37 seconds  Estimated Blood Loss:  Estimated blood loss: none.      [facility]

## 2019-07-16 NOTE — Transfer of Care (Signed)
Immediate Anesthesia Transfer of Care Note  Patient: Michelle Willis  Procedure(s) Performed: ESOPHAGOGASTRODUODENOSCOPY (EGD) WITH DILATION (N/A Mouth)  Patient Location: PACU  Anesthesia Type: General  Level of Consciousness: awake, alert  and patient cooperative  Airway and Oxygen Therapy: Patient Spontanous Breathing and Patient connected to supplemental oxygen  Post-op Assessment: Post-op Vital signs reviewed, Patient's Cardiovascular Status Stable, Respiratory Function Stable, Patent Airway and No signs of Nausea or vomiting  Post-op Vital Signs: Reviewed and stable  Complications: No apparent anesthesia complications

## 2019-07-16 NOTE — Anesthesia Postprocedure Evaluation (Signed)
Anesthesia Post Note  Patient: Michelle Willis  Procedure(s) Performed: ESOPHAGOGASTRODUODENOSCOPY (EGD) WITH DILATION (N/A Mouth)     Patient location during evaluation: PACU Anesthesia Type: General Level of consciousness: awake and alert Pain management: pain level controlled Vital Signs Assessment: post-procedure vital signs reviewed and stable Respiratory status: spontaneous breathing, nonlabored ventilation, respiratory function stable and patient connected to nasal cannula oxygen Cardiovascular status: blood pressure returned to baseline and stable Postop Assessment: no apparent nausea or vomiting Anesthetic complications: no    [name]  Cherise Fedder

## 2019-07-16 NOTE — Interval H&P Note (Signed)
History and Physical Interval Note:  07/16/2019 7:58 AM  Michelle Willis  has presented today for surgery, with the diagnosis of Dysphagia R13.10.  The various methods of treatment have been discussed with the patient and family. After consideration of risks, benefits and other options for treatment, the patient has consented to  Procedure(s): ESOPHAGOGASTRODUODENOSCOPY (EGD) WITH PROPOFOL (N/A) as a surgical intervention.  The patient's history has been reviewed, patient examined, no change in status, stable for surgery.  I have reviewed the patient's chart and labs.  Questions were answered to the patient's satisfaction.     Dorismar Chay [employer]

## 2019-07-19 ENCOUNTER — Encounter: Payer: Self-pay | Admitting: Gastroenterology

## 2019-07-26 ENCOUNTER — Encounter: Payer: Self-pay | Admitting: Gastroenterology

## 2019-09-01 ENCOUNTER — Other Ambulatory Visit: Payer: Self-pay | Admitting: Family Medicine

## 2019-09-01 DIAGNOSIS — F329 Major depressive disorder, single episode, unspecified: Secondary | ICD-10-CM

## 2019-09-01 DIAGNOSIS — F419 Anxiety disorder, unspecified: Secondary | ICD-10-CM

## 2019-09-01 DIAGNOSIS — F331 Major depressive disorder, recurrent, moderate: Secondary | ICD-10-CM

## 2019-09-06 ENCOUNTER — Other Ambulatory Visit: Payer: Self-pay

## 2019-09-06 ENCOUNTER — Encounter: Payer: Self-pay | Admitting: Family Medicine

## 2019-09-06 ENCOUNTER — Ambulatory Visit (INDEPENDENT_AMBULATORY_CARE_PROVIDER_SITE_OTHER): Payer: Medicare HMO | Admitting: Family Medicine

## 2019-09-06 VITALS — BP 124/70 | HR 68 | Ht 59.0 in | Wt 133.0 lb

## 2019-09-06 DIAGNOSIS — F419 Anxiety disorder, unspecified: Secondary | ICD-10-CM

## 2019-09-06 DIAGNOSIS — F5101 Primary insomnia: Secondary | ICD-10-CM

## 2019-09-06 DIAGNOSIS — K2100 Gastro-esophageal reflux disease with esophagitis, without bleeding: Secondary | ICD-10-CM | POA: Diagnosis not present

## 2019-09-06 DIAGNOSIS — F329 Major depressive disorder, single episode, unspecified: Secondary | ICD-10-CM | POA: Diagnosis not present

## 2019-09-06 MED ORDER — CITALOPRAM HYDROBROMIDE 20 MG PO TABS: 20.0000 mg | ORAL_TABLET | Freq: Every day | ORAL | 1 refills | Status: DC

## 2019-09-06 MED ORDER — AMITRIPTYLINE HCL 50 MG PO TABS: 50.0000 mg | ORAL_TABLET | Freq: Every day | ORAL | 1 refills | Status: DC

## 2019-09-06 MED ORDER — PANTOPRAZOLE SODIUM 40 MG PO TBEC: 40.0000 mg | DELAYED_RELEASE_TABLET | Freq: Every day | ORAL | 1 refills | Status: DC

## 2019-09-06 NOTE — Progress Notes (Signed)
Date:  09/06/2019   Name:  Michelle Willis   DOB:  Oct 30, 1950   MRN:  [id number]   Chief Complaint: Gastroesophageal Reflux, Depression (PHQ9=0 and GAD7=0), and Insomnia  Gastroesophageal Reflux She reports no abdominal pain, no belching, no chest pain, no choking, no coughing, no dysphagia, no early satiety, no globus sensation, no heartburn, no hoarse voice, no nausea, no sore throat, no stridor, no tooth decay, no water brash or no wheezing. This is a new problem. The current episode started more than 1 year ago. The problem occurs rarely. The problem has been gradually improving. The symptoms are aggravated by certain foods. Pertinent negatives include no anemia, fatigue, melena, muscle weakness, orthopnea or weight loss. There are no known risk factors. She has tried a PPI for the symptoms. The treatment provided moderate relief. Past procedures include an EGD.  Depression        This is a chronic problem.  The current episode started more than 1 year ago.   The onset quality is gradual.   The problem occurs every several days.  The problem has been gradually improving since onset.  Associated symptoms include insomnia.  Associated symptoms include no decreased concentration, no fatigue, no helplessness, no hopelessness, not irritable, no restlessness, no appetite change, no body aches, no myalgias, no headaches, no indigestion, not sad and no suicidal ideas.     Exacerbated by: covid.  Past treatments include SSRIs - Selective serotonin reuptake inhibitors.  Compliance with treatment is good.  Previous treatment provided moderate relief. Insomnia Primary symptoms: no fragmented sleep, no sleep disturbance, difficulty falling asleep, no somnolence, no frequent awakening, no premature morning awakening, no malaise/fatigue, no napping.   The current episode started more than one year. PMH includes: no hypertension, depression, no family stress or anxiety.    Lab Results  Component Value Date   CREATININE 0.83 08/28/2017   BUN 16 08/28/2017   NA 142 08/28/2017   K 4.8 08/28/2017   CL 105 08/28/2017   CO2 24 08/28/2017   No results found for: CHOL, HDL, LDLCALC, LDLDIRECT, TRIG, CHOLHDL No results found for: TSH No results found for: HGBA1C   Review of Systems  Constitutional: Negative.  Negative for appetite change, chills, fatigue, fever, malaise/fatigue, unexpected weight change and weight loss.  HENT: Negative for congestion, ear discharge, ear pain, hoarse voice, rhinorrhea, sinus pressure, sneezing and sore throat.   Eyes: Negative for photophobia, pain, discharge, redness and itching.  Respiratory: Negative for cough, choking, shortness of breath, wheezing and stridor.   Cardiovascular: Negative for chest pain.  Gastrointestinal: Negative for abdominal pain, blood in stool, constipation, diarrhea, dysphagia, heartburn, melena, nausea and vomiting.  Endocrine: Negative for cold intolerance, heat intolerance, polydipsia, polyphagia and polyuria.  Genitourinary: Negative for dysuria, flank pain, frequency, hematuria, menstrual problem, pelvic pain, urgency, vaginal bleeding and vaginal discharge.  Musculoskeletal: Negative for arthralgias, back pain, myalgias and muscle weakness.  Skin: Negative for rash.  Allergic/Immunologic: Negative for environmental allergies and food allergies.  Neurological: Negative for dizziness, weakness, light-headedness, numbness and headaches.  Hematological: Negative for adenopathy. Does not bruise/bleed easily.  Psychiatric/Behavioral: Positive for depression. Negative for decreased concentration, dysphoric mood, sleep disturbance and suicidal ideas. The patient has insomnia. The patient is not nervous/anxious.     Patient Active Problem List   Diagnosis Date Noted  . Problems with swallowing and mastication   . Stricture and stenosis of esophagus   . Encounter for screening colonoscopy   . Primary insomnia 03/13/2017  .  Essential  hypertension 09/30/2016  . Anxiety and depression 09/30/2016  . Major depressive disorder, recurrent episode, moderate ([place]) 03/28/2015    No Known Allergies  Past Surgical History:  Procedure Laterality Date  . ABDOMINAL HYSTERECTOMY    . CHOLECYSTECTOMY    . COLONOSCOPY WITH PROPOFOL N/A 06/24/2019   Procedure: COLONOSCOPY WITH PROPOFOL;  Surgeon: Lucilla Lame, MD;  Location: [facility];  Service: Endoscopy;  Laterality: N/A;  . ESOPHAGOGASTRODUODENOSCOPY (EGD) WITH PROPOFOL N/A 07/16/2019   Procedure: ESOPHAGOGASTRODUODENOSCOPY (EGD) WITH DILATION;  Surgeon: Lucilla Lame, MD;  Location: [facility];  Service: Endoscopy;  Laterality: N/A;  . VAGINAL HYSTERECTOMY      Social History   Tobacco Use  . Smoking status: Never Smoker  . Smokeless tobacco: Never Used  Substance Use Topics  . Alcohol use: No    Alcohol/week: 0.0 standard drinks  . Drug use: No     Medication list has been reviewed and updated.  Current Meds  Medication Sig  . amitriptyline (ELAVIL) 50 MG tablet Take 1 tablet (50 mg total) by mouth at bedtime.  . Ascorbic Acid (VITAMIN C ADULT GUMMIES PO)   . Calcium Carbonate (CALCIUM 600 PO) Take 1 tablet by mouth daily.  . Cholecalciferol (VITAMIN D3 ADULT GUMMIES PO) Take 1 tablet by mouth daily.   . citalopram (CELEXA) 20 MG tablet Take 1 tablet by mouth once daily  . pantoprazole (PROTONIX) 40 MG tablet Take 1 tablet (40 mg total) by mouth daily.    PHQ 2/9 Scores 09/06/2019 06/03/2019 12/30/2018 08/05/2018  PHQ - 2 Score 0 0 0 0  PHQ- 9 Score 0 0 0 0    BP Readings from Last 3 Encounters:  09/06/19 124/70  07/16/19 116/71  07/07/19 119/72    Physical Exam Vitals and nursing note reviewed.  Constitutional:      General: She is not irritable.    Appearance: She is well-developed.  HENT:     Head: Normocephalic.     Right Ear: Tympanic membrane, ear canal and external ear normal.     Left Ear: Tympanic membrane, ear canal and  external ear normal.     Nose: Nose normal.     Mouth/Throat:     Mouth: Mucous membranes are moist.  Eyes:     General: Lids are everted, no foreign bodies appreciated. No scleral icterus.       Left eye: No foreign body or hordeolum.     Conjunctiva/sclera: Conjunctivae normal.     Right eye: Right conjunctiva is not injected.     Left eye: Left conjunctiva is not injected.     Pupils: Pupils are equal, round, and reactive to light.  Neck:     Thyroid: No thyromegaly.     Vascular: No JVD.     Trachea: No tracheal deviation.  Cardiovascular:     Rate and Rhythm: Normal rate and regular rhythm.     Pulses: Normal pulses.     Heart sounds: Normal heart sounds. No murmur. No friction rub. No gallop.   Pulmonary:     Effort: Pulmonary effort is normal. No respiratory distress.     Breath sounds: Normal breath sounds. No wheezing, rhonchi or rales.  Abdominal:     General: Bowel sounds are normal.     Palpations: Abdomen is soft. There is no mass.     Tenderness: There is no abdominal tenderness. There is no guarding or rebound.  Musculoskeletal:        General: No tenderness.  Normal range of motion.     Cervical back: Normal range of motion and neck supple.  Lymphadenopathy:     Cervical: No cervical adenopathy.  Skin:    General: Skin is warm.     Findings: No rash.  Neurological:     Mental Status: She is alert and oriented to person, place, and time.     Cranial Nerves: No cranial nerve deficit.     Deep Tendon Reflexes: Reflexes normal.  Psychiatric:        Mood and Affect: Mood is not anxious or depressed.     Wt Readings from Last 3 Encounters:  09/06/19 133 lb (60.3 kg)  07/16/19 132 lb (59.9 kg)  07/07/19 132 lb 3.2 oz (60 kg)    BP 124/70   Pulse 68   Ht 4\' 11"  (1.499 m)   Wt 133 lb (60.3 kg)   BMI 26.86 kg/m   Assessment and Plan:  1. Primary insomnia Chronic.  Controlled.  Stable.  Continue amitriptyline 50 mg nightly. - amitriptyline (ELAVIL) 50  MG tablet; Take 1 tablet (50 mg total) by mouth at bedtime.  Dispense: 90 tablet; Refill: 1  2. Anxiety and depression Chronic.  Controlled.  Stable.  PHQ score 0.  Gad score is 0.  We will continue citalopram 20 mg once a day.  Will recheck in 6 months. - citalopram (CELEXA) 20 MG tablet; Take 1 tablet (20 mg total) by mouth daily.  Dispense: 90 tablet; Refill: 1  3. Gastroesophageal reflux disease with esophagitis without hemorrhage Review of endoscopy noted that on biopsy that there were some areas suggestive of esophagitis secondary to GERD.Marland Kitchen  Patient will continue pantoprazole 40 mg once a day. - pantoprazole (PROTONIX) 40 MG tablet; Take 1 tablet (40 mg total) by mouth daily.  Dispense: 90 tablet; Refill: 1

## 2019-12-02 ENCOUNTER — Ambulatory Visit: Payer: Medicare HMO | Admitting: Family Medicine

## 2020-01-06 ENCOUNTER — Telehealth: Payer: Self-pay | Admitting: Family Medicine

## 2020-01-06 NOTE — Telephone Encounter (Signed)
Left message for patient to call back and schedule Medicare Annual Wellness Visit (AWV) either virtually/audio only or in office. Whichever the patients preference is.  Last AWV 12/30/2018; please schedule at anytime with [facility] Health Advisor.

## 2020-03-02 ENCOUNTER — Other Ambulatory Visit: Payer: Self-pay | Admitting: Family Medicine

## 2020-03-02 DIAGNOSIS — F419 Anxiety disorder, unspecified: Secondary | ICD-10-CM

## 2020-03-02 DIAGNOSIS — K2100 Gastro-esophageal reflux disease with esophagitis, without bleeding: Secondary | ICD-10-CM

## 2020-03-13 ENCOUNTER — Other Ambulatory Visit: Payer: Self-pay | Admitting: Family Medicine

## 2020-03-13 DIAGNOSIS — F5101 Primary insomnia: Secondary | ICD-10-CM

## 2020-03-13 MED ORDER — AMITRIPTYLINE HCL 50 MG PO TABS: 50.0000 mg | ORAL_TABLET | Freq: Every day | ORAL | 0 refills | Status: DC

## 2020-03-13 NOTE — Telephone Encounter (Signed)
Medication Refill - Medication: amitriptyline (ELAVIL) 50 MG tablet    Preferred Pharmacy (with phone number or street name): [facility], [place] Phone:  [phone]  Fax:  [phone]       Agent: Please be advised that RX refills may take up to 3 business days. We ask that you follow-up with your pharmacy.

## 2020-03-13 NOTE — Telephone Encounter (Signed)
Attempted to call patient to schedule medication follow up- left message to call office to schedule. Courtesy RF #[phone] given

## 2020-03-14 ENCOUNTER — Ambulatory Visit: Payer: Medicare HMO | Admitting: Family Medicine

## 2020-03-14 ENCOUNTER — Other Ambulatory Visit: Payer: Self-pay

## 2020-03-14 ENCOUNTER — Encounter: Payer: Self-pay | Admitting: Family Medicine

## 2020-03-14 VITALS — BP 130/82 | HR 72 | Ht 59.0 in | Wt 131.0 lb

## 2020-03-14 DIAGNOSIS — M810 Age-related osteoporosis without current pathological fracture: Secondary | ICD-10-CM

## 2020-03-14 DIAGNOSIS — F419 Anxiety disorder, unspecified: Secondary | ICD-10-CM

## 2020-03-14 DIAGNOSIS — F329 Major depressive disorder, single episode, unspecified: Secondary | ICD-10-CM | POA: Diagnosis not present

## 2020-03-14 DIAGNOSIS — K2100 Gastro-esophageal reflux disease with esophagitis, without bleeding: Secondary | ICD-10-CM | POA: Diagnosis not present

## 2020-03-14 DIAGNOSIS — Z1231 Encounter for screening mammogram for malignant neoplasm of breast: Secondary | ICD-10-CM

## 2020-03-14 DIAGNOSIS — F5101 Primary insomnia: Secondary | ICD-10-CM

## 2020-03-14 MED ORDER — PANTOPRAZOLE SODIUM 40 MG PO TBEC: 40.0000 mg | DELAYED_RELEASE_TABLET | Freq: Every day | ORAL | 1 refills | Status: DC

## 2020-03-14 MED ORDER — CITALOPRAM HYDROBROMIDE 20 MG PO TABS: 20.0000 mg | ORAL_TABLET | Freq: Every day | ORAL | 1 refills | Status: DC

## 2020-03-14 MED ORDER — AMITRIPTYLINE HCL 50 MG PO TABS: 50.0000 mg | ORAL_TABLET | Freq: Every day | ORAL | 1 refills | Status: DC

## 2020-03-14 NOTE — Progress Notes (Signed)
Date:  03/14/2020   Name:  Michelle Willis   DOB:  May 01, 1951   MRN:  [id number]   Chief Complaint: Depression, Gastroesophageal Reflux, and Insomnia  Depression        This is a chronic problem.  The current episode started more than 1 year ago.   The onset quality is gradual.   The problem occurs intermittently.  The problem has been gradually improving since onset.  Associated symptoms include insomnia.  Associated symptoms include no decreased concentration, no fatigue, no helplessness, no hopelessness, not irritable, no restlessness, no decreased interest, no appetite change, no body aches, no myalgias, no headaches, no indigestion, not sad and no suicidal ideas.     The symptoms are aggravated by medication.  Past treatments include SSRIs - Selective serotonin reuptake inhibitors.  Compliance with treatment is good.  Previous treatment provided significant relief. Gastroesophageal Reflux She reports no abdominal pain, no belching, no chest pain, no choking, no coughing, no dysphagia, no early satiety, no globus sensation, no heartburn, no hoarse voice, no nausea, no sore throat, no stridor, no tooth decay, no water brash or no wheezing. This is a chronic problem. The current episode started more than 1 year ago. The problem has been gradually improving. The symptoms are aggravated by certain foods. Pertinent negatives include no anemia, fatigue, melena, muscle weakness, orthopnea or weight loss. She has tried a PPI for the symptoms. The treatment provided moderate relief. Past procedures do not include an abdominal ultrasound, an EGD, esophageal manometry, esophageal pH monitoring, H. pylori antibody titer or a UGI.  Insomnia Primary symptoms: no fragmented sleep, no sleep disturbance, no difficulty falling asleep, no somnolence, no frequent awakening, no premature morning awakening, no malaise/fatigue, no napping.   The current episode started more than one year. PMH includes: depression.     Lab Results  Component Value Date   CREATININE 0.83 08/28/2017   BUN 16 08/28/2017   NA 142 08/28/2017   K 4.8 08/28/2017   CL 105 08/28/2017   CO2 24 08/28/2017   No results found for: CHOL, HDL, LDLCALC, LDLDIRECT, TRIG, CHOLHDL No results found for: TSH No results found for: HGBA1C No results found for: WBC, HGB, HCT, MCV, PLT No results found for: ALT, AST, GGT, ALKPHOS, BILITOT   Review of Systems  Constitutional: Negative.  Negative for appetite change, chills, fatigue, fever, malaise/fatigue, unexpected weight change and weight loss.  HENT: Negative for congestion, ear discharge, ear pain, hoarse voice, rhinorrhea, sinus pressure, sneezing and sore throat.   Eyes: Negative for photophobia, pain, discharge, redness and itching.  Respiratory: Negative for cough, choking, shortness of breath, wheezing and stridor.   Cardiovascular: Negative for chest pain.  Gastrointestinal: Negative for abdominal pain, blood in stool, constipation, diarrhea, dysphagia, heartburn, melena, nausea and vomiting.  Endocrine: Negative for cold intolerance, heat intolerance, polydipsia, polyphagia and polyuria.  Genitourinary: Negative for dysuria, flank pain, frequency, hematuria, menstrual problem, pelvic pain, urgency, vaginal bleeding and vaginal discharge.  Musculoskeletal: Negative for arthralgias, back pain, myalgias and muscle weakness.  Skin: Negative for rash.  Allergic/Immunologic: Negative for environmental allergies and food allergies.  Neurological: Negative for dizziness, weakness, light-headedness, numbness and headaches.  Hematological: Negative for adenopathy. Does not bruise/bleed easily.  Psychiatric/Behavioral: Positive for depression. Negative for decreased concentration, dysphoric mood, sleep disturbance and suicidal ideas. The patient has insomnia. The patient is not nervous/anxious.     Patient Active Problem List   Diagnosis Date Noted  . Gastroesophageal reflux  disease with  esophagitis without hemorrhage 09/06/2019  . Problems with swallowing and mastication   . Stricture and stenosis of esophagus   . Encounter for screening colonoscopy   . Primary insomnia 03/13/2017  . Essential hypertension 09/30/2016  . Anxiety and depression 09/30/2016  . Major depressive disorder, recurrent episode, moderate ([place]) 03/28/2015    No Known Allergies  Past Surgical History:  Procedure Laterality Date  . ABDOMINAL HYSTERECTOMY    . CHOLECYSTECTOMY    . COLONOSCOPY WITH PROPOFOL N/A 06/24/2019   Procedure: COLONOSCOPY WITH PROPOFOL;  Surgeon: Lucilla Lame, MD;  Location: [facility];  Service: Endoscopy;  Laterality: N/A;  . ESOPHAGOGASTRODUODENOSCOPY (EGD) WITH PROPOFOL N/A 07/16/2019   Procedure: ESOPHAGOGASTRODUODENOSCOPY (EGD) WITH DILATION;  Surgeon: Lucilla Lame, MD;  Location: [facility];  Service: Endoscopy;  Laterality: N/A;  . VAGINAL HYSTERECTOMY      Social History   Tobacco Use  . Smoking status: Never Smoker  . Smokeless tobacco: Never Used  Vaping Use  . Vaping Use: Never used  Substance Use Topics  . Alcohol use: No    Alcohol/week: 0.0 standard drinks  . Drug use: No     Medication list has been reviewed and updated.  Current Meds  Medication Sig  . amitriptyline (ELAVIL) 50 MG tablet Take 1 tablet (50 mg total) by mouth at bedtime.  . Ascorbic Acid (VITAMIN C ADULT GUMMIES PO)   . Calcium Carbonate (CALCIUM 600 PO) Take 1 tablet by mouth daily.  . Cholecalciferol (VITAMIN D3 ADULT GUMMIES PO) Take 1 tablet by mouth daily.   . citalopram (CELEXA) 20 MG tablet TAKE 1 TABLET EVERY DAY  . pantoprazole (PROTONIX) 40 MG tablet TAKE 1 TABLET EVERY DAY    PHQ 2/9 Scores 03/14/2020 09/06/2019 06/03/2019 12/30/2018  PHQ - 2 Score 0 0 0 0  PHQ- 9 Score 0 0 0 0    GAD 7 : Generalized Anxiety Score 03/14/2020 09/06/2019 06/03/2019  Nervous, Anxious, on Edge 0 0 0  Control/stop worrying 0 0 0  Worry too much - different  things 0 0 0  Trouble relaxing 0 0 0  Restless 0 0 0  Easily annoyed or irritable 0 0 0  Afraid - awful might happen 0 0 0  Total GAD 7 Score 0 0 0    BP Readings from Last 3 Encounters:  03/14/20 130/82  09/06/19 124/70  07/16/19 116/71    Physical Exam Vitals and nursing note reviewed.  Constitutional:      General: She is not irritable.    Appearance: She is well-developed.  HENT:     Head: Normocephalic.     Right Ear: Tympanic membrane, ear canal and external ear normal.     Left Ear: Tympanic membrane, ear canal and external ear normal.     Nose: Nose normal.     Mouth/Throat:     Mouth: Mucous membranes are moist.  Eyes:     General: Lids are everted, no foreign bodies appreciated. No scleral icterus.       Left eye: No foreign body or hordeolum.     Conjunctiva/sclera: Conjunctivae normal.     Right eye: Right conjunctiva is not injected.     Left eye: Left conjunctiva is not injected.     Pupils: Pupils are equal, round, and reactive to light.  Neck:     Thyroid: No thyromegaly.     Vascular: No JVD.     Trachea: No tracheal deviation.  Cardiovascular:     Rate and Rhythm:  Normal rate and regular rhythm.     Heart sounds: Normal heart sounds. No murmur heard.  No friction rub. No gallop.   Pulmonary:     Effort: Pulmonary effort is normal. No respiratory distress.     Breath sounds: Normal breath sounds. No wheezing, rhonchi or rales.  Abdominal:     General: Bowel sounds are normal.     Palpations: Abdomen is soft. There is no mass.     Tenderness: There is no abdominal tenderness. There is no guarding or rebound.  Musculoskeletal:        General: No tenderness. Normal range of motion.     Cervical back: Normal range of motion and neck supple.  Lymphadenopathy:     Cervical: No cervical adenopathy.  Skin:    General: Skin is warm.     Findings: No rash.  Neurological:     Mental Status: She is alert and oriented to person, place, and time.      Cranial Nerves: No cranial nerve deficit.     Deep Tendon Reflexes: Reflexes normal.  Psychiatric:        Mood and Affect: Mood is not anxious or depressed.     Wt Readings from Last 3 Encounters:  03/14/20 131 lb (59.4 kg)  09/06/19 133 lb (60.3 kg)  07/16/19 132 lb (59.9 kg)    BP 130/82   Pulse 72   Ht 4\' 11"  (1.499 m)   Wt 131 lb (59.4 kg)   BMI 26.46 kg/m   Assessment and Plan: 1. Anxiety and depression Chronic.  Controlled.  Stable.  Uncomplicated.  PHQ score 0 Gad score is 0 patient will continue citalopram 20 mg once a day.  There has been significant improvement in her socialization with friends and emotional disposition. - citalopram (CELEXA) 20 MG tablet; Take 1 tablet (20 mg total) by mouth daily.  Dispense: 90 tablet; Refill: 1  2. Gastroesophageal reflux disease with esophagitis without hemorrhage Chronic.  Controlled.  Stable.  Continue pantoprazole 40 mg once a day. - pantoprazole (PROTONIX) 40 MG tablet; Take 1 tablet (40 mg total) by mouth daily.  Dispense: 90 tablet; Refill: 1  3. Primary insomnia Chronic.  Controlled.  Stable.  Continue amitriptyline 50 mg once at bedtime. - amitriptyline (ELAVIL) 50 MG tablet; Take 1 tablet (50 mg total) by mouth at bedtime.  Dispense: 90 tablet; Refill: 1  4. Age-related osteoporosis without current pathological fracture Chronic.  Controlled.  Stable.  In 2019 when she was noted to be osteoporotic and was started on Fosamax she is currently controlling with vitamin D and calcium supplementation.  5. Breast cancer screening by mammogram Discussed with patient and we will obtain a 3D bilateral breast mammogram. - MM 3D SCREEN BREAST BILATERAL; Future

## 2020-03-22 ENCOUNTER — Ambulatory Visit
Admission: RE | Admit: 2020-03-22 | Discharge: 2020-03-22 | Disposition: A | Discharge status: Home / Self Care | Payer: Medicare HMO | Source: Ambulatory Visit | Attending: Family Medicine | Admitting: Family Medicine

## 2020-03-22 ENCOUNTER — Other Ambulatory Visit: Payer: Self-pay

## 2020-03-22 DIAGNOSIS — Z1231 Encounter for screening mammogram for malignant neoplasm of breast: Secondary | ICD-10-CM | POA: Insufficient documentation

## 2020-05-09 ENCOUNTER — Other Ambulatory Visit: Payer: Self-pay | Admitting: Family Medicine

## 2020-05-09 DIAGNOSIS — F5101 Primary insomnia: Secondary | ICD-10-CM

## 2020-05-23 ENCOUNTER — Telehealth: Payer: Self-pay | Admitting: Family Medicine

## 2020-05-23 NOTE — Telephone Encounter (Signed)
Left message for patient to call back and schedule Medicare Annual Wellness Visit (AWV) either virtually/audio only or in office. Whichever the patients preference is.  Last AWV 12/30/2018; please schedule at anytime with [facility] Health Advisor.  This should be a 40 minute visit.

## 2020-06-15 ENCOUNTER — Other Ambulatory Visit: Payer: Self-pay | Admitting: Family Medicine

## 2020-06-15 DIAGNOSIS — F5101 Primary insomnia: Secondary | ICD-10-CM

## 2020-08-29 ENCOUNTER — Ambulatory Visit: Payer: Medicare HMO | Admitting: Family Medicine

## 2020-08-31 ENCOUNTER — Other Ambulatory Visit: Payer: Self-pay

## 2020-08-31 ENCOUNTER — Encounter: Payer: Self-pay | Admitting: Family Medicine

## 2020-08-31 ENCOUNTER — Ambulatory Visit (INDEPENDENT_AMBULATORY_CARE_PROVIDER_SITE_OTHER): Payer: Medicare Other | Admitting: Family Medicine

## 2020-08-31 VITALS — BP 120/80 | HR 68 | Ht 59.0 in | Wt 121.0 lb

## 2020-08-31 DIAGNOSIS — K2100 Gastro-esophageal reflux disease with esophagitis, without bleeding: Secondary | ICD-10-CM

## 2020-08-31 DIAGNOSIS — N3090 Cystitis, unspecified without hematuria: Secondary | ICD-10-CM

## 2020-08-31 DIAGNOSIS — F419 Anxiety disorder, unspecified: Secondary | ICD-10-CM

## 2020-08-31 DIAGNOSIS — F5101 Primary insomnia: Secondary | ICD-10-CM | POA: Diagnosis not present

## 2020-08-31 DIAGNOSIS — R69 Illness, unspecified: Secondary | ICD-10-CM

## 2020-08-31 DIAGNOSIS — F32A Depression, unspecified: Secondary | ICD-10-CM

## 2020-08-31 LAB — POCT URINALYSIS DIPSTICK
Bilirubin, UA: NEGATIVE
Glucose, UA: NEGATIVE
Ketones, UA: NEGATIVE
Nitrite, UA: NEGATIVE
Protein, UA: NEGATIVE
Spec Grav, UA: 1.02 (ref 1.010–1.025)
Urobilinogen, UA: 0.2 E.U./dL
pH, UA: 6 (ref 5.0–8.0)

## 2020-08-31 MED ORDER — PANTOPRAZOLE SODIUM 40 MG PO TBEC: 40.0000 mg | DELAYED_RELEASE_TABLET | Freq: Every day | ORAL | 1 refills | Status: DC

## 2020-08-31 MED ORDER — CEPHALEXIN 500 MG PO CAPS: 500.0000 mg | ORAL_CAPSULE | Freq: Three times a day (TID) | ORAL | 0 refills | Status: DC

## 2020-08-31 MED ORDER — CITALOPRAM HYDROBROMIDE 20 MG PO TABS: 20.0000 mg | ORAL_TABLET | Freq: Every day | ORAL | 1 refills | Status: DC

## 2020-08-31 MED ORDER — AMITRIPTYLINE HCL 50 MG PO TABS: 50.0000 mg | ORAL_TABLET | Freq: Every day | ORAL | 1 refills | Status: DC

## 2020-08-31 NOTE — Progress Notes (Signed)
Date:  08/31/2020   Name:  Michelle Willis   DOB:  01-31-1951   MRN:  [id number]   Chief Complaint: Urinary Tract Infection (Pain when urinating x 3 days), Gastroesophageal Reflux, Depression, and Insomnia  Urinary Tract Infection  This is a new problem. The current episode started in the past 7 days. The problem has been gradually worsening. The quality of the pain is described as burning. The pain is mild. Associated symptoms include frequency and urgency. Pertinent negatives include no chills, discharge, flank pain, hematuria, hesitancy, nausea, sweats or vomiting. She has tried nothing for the symptoms. The treatment provided moderate relief.  Gastroesophageal Reflux She reports no abdominal pain, no belching, no chest pain, no choking, no coughing, no dysphagia, no early satiety, no globus sensation, no heartburn, no hoarse voice, no nausea, no sore throat, no stridor, no tooth decay, no water brash or no wheezing. This is a chronic problem. The current episode started more than 1 year ago. The problem has been gradually improving. Pertinent negatives include no fatigue. She has tried a PPI for the symptoms. The treatment provided moderate relief. Past procedures do not include an abdominal ultrasound, an EGD, esophageal manometry, esophageal pH monitoring, H. pylori antibody titer or a UGI.  Depression        This is a chronic problem.  The current episode started more than 1 year ago.   The problem has been gradually improving since onset.  Associated symptoms include insomnia.  Associated symptoms include no decreased concentration, no fatigue, no helplessness, no hopelessness, not irritable, no restlessness, no decreased interest, no appetite change, no body aches, no myalgias, no headaches, no indigestion, not sad and no suicidal ideas.  Past treatments include SSRIs - Selective serotonin reuptake inhibitors.  Compliance with treatment is good.  Previous treatment provided moderate  relief. Insomnia Primary symptoms: no fragmented sleep, no difficulty falling asleep.  The problem occurs intermittently. The problem has been waxing and waning since onset. PMH includes: depression.    Lab Results  Component Value Date   CREATININE 0.83 08/28/2017   BUN 16 08/28/2017   NA 142 08/28/2017   K 4.8 08/28/2017   CL 105 08/28/2017   CO2 24 08/28/2017   No results found for: CHOL, HDL, LDLCALC, LDLDIRECT, TRIG, CHOLHDL No results found for: TSH No results found for: HGBA1C No results found for: WBC, HGB, HCT, MCV, PLT No results found for: ALT, AST, GGT, ALKPHOS, BILITOT   Review of Systems  Constitutional: Negative.  Negative for appetite change, chills, fatigue, fever and unexpected weight change.  HENT: Negative for congestion, ear discharge, ear pain, hoarse voice, rhinorrhea, sinus pressure, sneezing and sore throat.   Eyes: Negative for double vision, photophobia, pain, discharge, redness and itching.  Respiratory: Negative for cough, choking, shortness of breath, wheezing and stridor.   Cardiovascular: Negative for chest pain.  Gastrointestinal: Negative for abdominal pain, blood in stool, constipation, diarrhea, dysphagia, heartburn, nausea and vomiting.  Endocrine: Negative for cold intolerance, heat intolerance, polydipsia, polyphagia and polyuria.  Genitourinary: Positive for frequency and urgency. Negative for dysuria, flank pain, hematuria, hesitancy, menstrual problem, pelvic pain, vaginal bleeding and vaginal discharge.  Musculoskeletal: Negative for arthralgias, back pain and myalgias.  Skin: Negative for rash.  Allergic/Immunologic: Negative for environmental allergies and food allergies.  Neurological: Negative for dizziness, weakness, light-headedness, numbness and headaches.  Hematological: Negative for adenopathy. Does not bruise/bleed easily.  Psychiatric/Behavioral: Positive for depression. Negative for decreased concentration, dysphoric mood and  suicidal ideas.  The patient has insomnia. The patient is not nervous/anxious.     Patient Active Problem List   Diagnosis Date Noted  . Gastroesophageal reflux disease with esophagitis without hemorrhage 09/06/2019  . Problems with swallowing and mastication   . Stricture and stenosis of esophagus   . Encounter for screening colonoscopy   . Primary insomnia 03/13/2017  . Essential hypertension 09/30/2016  . Anxiety and depression 09/30/2016  . Major depressive disorder, recurrent episode, moderate ([facility]) 03/28/2015    No Known Allergies  Past Surgical History:  Procedure Laterality Date  . ABDOMINAL HYSTERECTOMY    . CHOLECYSTECTOMY    . COLONOSCOPY WITH PROPOFOL N/A 06/24/2019   Procedure: COLONOSCOPY WITH PROPOFOL;  Surgeon: Midge Minium, MD;  [facility];  Service: Endoscopy;  Laterality: N/A;  . ESOPHAGOGASTRODUODENOSCOPY (EGD) WITH PROPOFOL N/A 07/16/2019   Procedure: ESOPHAGOGASTRODUODENOSCOPY (EGD) WITH DILATION;  Surgeon: Midge Minium, MD;  Location: [facility];  Service: Endoscopy;  Laterality: N/A;  . VAGINAL HYSTERECTOMY      Social History   Tobacco Use  . Smoking status: Never Smoker  . Smokeless tobacco: Never Used  Vaping Use  . Vaping Use: Never used  Substance Use Topics  . Alcohol use: No    Alcohol/week: 0.0 standard drinks  . Drug use: No     Medication list has been reviewed and updated.  Current Meds  Medication Sig  . amitriptyline (ELAVIL) 50 MG tablet Take 1 tablet (50 mg total) by mouth at bedtime.  . Ascorbic Acid (VITAMIN C ADULT GUMMIES PO)   . Calcium Carbonate (CALCIUM 600 PO) Take 1 tablet by mouth daily.  . Cholecalciferol (VITAMIN D3 ADULT GUMMIES PO) Take 1 tablet by mouth daily.   . citalopram (CELEXA) 20 MG tablet Take 1 tablet (20 mg total) by mouth daily.  . pantoprazole (PROTONIX) 40 MG tablet Take 1 tablet (40 mg total) by mouth daily.    PHQ 2/9 Scores 08/31/2020 03/14/2020 09/06/2019 06/03/2019  PHQ  - 2 Score 0 0 0 0  PHQ- 9 Score 0 0 0 0    GAD 7 : Generalized Anxiety Score 03/14/2020 09/06/2019 06/03/2019  Nervous, Anxious, on Edge 0 0 0  Control/stop worrying 0 0 0  Worry too much - different things 0 0 0  Trouble relaxing 0 0 0  Restless 0 0 0  Easily annoyed or irritable 0 0 0  Afraid - awful might happen 0 0 0  Total GAD 7 Score 0 0 0    BP Readings from Last 3 Encounters:  08/31/20 120/80  03/14/20 130/82  09/06/19 124/70    Physical Exam Vitals and nursing note reviewed.  Constitutional:      General: She is not irritable.She is not in acute distress.    Appearance: She is not diaphoretic.  HENT:     Head: Normocephalic and atraumatic.     Right Ear: Tympanic membrane and external ear normal.     Left Ear: Tympanic membrane and external ear normal.     Nose: Nose normal. No congestion or rhinorrhea.     Mouth/Throat:     Mouth: Oropharynx is clear and moist. Mucous membranes are moist.  Eyes:     General:        Right eye: No discharge.        Left eye: No discharge.     Extraocular Movements: EOM normal.     Conjunctiva/sclera: Conjunctivae normal.     Pupils: Pupils are equal, round, and reactive to  light.  Neck:     Thyroid: No thyromegaly.     Vascular: No JVD.  Cardiovascular:     Rate and Rhythm: Normal rate and regular rhythm.     Pulses: Intact distal pulses.     Heart sounds: Normal heart sounds, S1 normal and S2 normal. No murmur heard.  No systolic murmur is present.  No diastolic murmur is present. No friction rub. No gallop. No S3 or S4 sounds.   Pulmonary:     Effort: Pulmonary effort is normal.     Breath sounds: Normal breath sounds. No wheezing, rhonchi or rales.  Abdominal:     General: Bowel sounds are normal.     Palpations: Abdomen is soft. There is no hepatomegaly, splenomegaly or mass.     Tenderness: There is abdominal tenderness in the suprapubic area. There is no guarding.  Musculoskeletal:        General: No edema. Normal  range of motion.     Cervical back: Normal range of motion and neck supple.  Lymphadenopathy:     Cervical: No cervical adenopathy.  Skin:    General: Skin is warm and dry.  Neurological:     Mental Status: She is alert.     Deep Tendon Reflexes: Reflexes are normal and symmetric.     Wt Readings from Last 3 Encounters:  08/31/20 121 lb (54.9 kg)  03/14/20 131 lb (59.4 kg)  09/06/19 133 lb (60.3 kg)    BP 120/80   Pulse 68   Ht 4\' 11"  (1.499 m)   Wt 121 lb (54.9 kg)   BMI 24.44 kg/m   Assessment and Plan:  1. Primary insomnia Chronic.  Controlled.  Stable.  Continue amitriptyline 50 mg nightly. - amitriptyline (ELAVIL) 50 MG tablet; Take 1 tablet (50 mg total) by mouth at bedtime.  Dispense: 90 tablet; Refill: 1  2. Anxiety and depression Chronic.  Controlled.  Stable.  PHQ is 0 Gad score 0 patient has suggested that she has been in a new church and is finding it very therapeutic.  We will continue citalopram 20 mg once a day. - citalopram (CELEXA) 20 MG tablet; Take 1 tablet (20 mg total) by mouth daily.  Dispense: 90 tablet; Refill: 1  3. Gastroesophageal reflux disease with esophagitis without hemorrhage .  Controlled.  Stable.  Continue pantoprazole 40 mg once a day.  Patient states that this is a significant improvement in her past and definitely wants to continue with therapy. - pantoprazole (PROTONIX) 40 MG tablet; Take 1 tablet (40 mg total) by mouth daily.  Dispense: 90 tablet; Refill: 1  4. Taking medication for chronic disease On it.  Controlled.  Stable.  Will check lipid and renal function panel to assess any medication side effect concerns. - Lipid Panel With LDL/HDL Ratio - Renal Function Panel  5. Cystitis New onset.  Persistent.  Stable.  Patient has had a 3-day history of frequency urgency dysuria.  Urinalysis notes leukocytes consistent with UTI.  We will treat with cephalexin 500 mg 3 times a day for 5 days. - POCT urinalysis dipstick - cephALEXin  (KEFLEX) 500 MG capsule; Take 1 capsule (500 mg total) by mouth 3 (three) times daily.  Dispense: 15 capsule; Refill: 0

## 2020-09-01 LAB — RENAL FUNCTION PANEL
Albumin: 4.3 g/dL (ref 3.7–4.7)
BUN/Creatinine Ratio: 25 (ref 12–28)
BUN: 20 mg/dL (ref 8–27)
CO2: 24 mmol/L (ref 20–29)
Calcium: 9.6 mg/dL (ref 8.7–10.3)
Chloride: 105 mmol/L (ref 96–106)
Creatinine, Ser: 0.81 mg/dL (ref 0.57–1.00)
GFR calc Af Amer: 85 mL/min/{1.73_m2} (ref >59)
GFR calc non Af Amer: 73 mL/min/{1.73_m2} (ref >59)
Glucose: 88 mg/dL (ref 65–99)
Phosphorus: 3.5 mg/dL (ref 3.0–4.3)
Potassium: 4.7 mmol/L (ref 3.5–5.2)
Sodium: 143 mmol/L (ref 134–144)

## 2020-09-01 LAB — LIPID PANEL WITH LDL/HDL RATIO
Cholesterol, Total: 211 mg/dL — ABNORMAL HIGH (ref 100–199)
HDL: 61 mg/dL (ref >39)
LDL Chol Calc (NIH): 117 mg/dL — ABNORMAL HIGH (ref 0–99)
LDL/HDL Ratio: 1.9 ratio (ref 0.0–3.2)
Triglycerides: 193 mg/dL — ABNORMAL HIGH (ref 0–149)
VLDL Cholesterol Cal: 33 mg/dL (ref 5–40)

## 2020-09-19 ENCOUNTER — Ambulatory Visit: Payer: Self-pay | Admitting: *Deleted

## 2020-09-19 NOTE — Telephone Encounter (Signed)
Called and left message on pt's machine for her to follow protocol and go to ER for further eval

## 2020-09-19 NOTE — Telephone Encounter (Signed)
Patient reports she had food get stuck in throat last night- she is having some irritation. Piece of pork chop got stuck in throat last night- she did get it down but throat feels swollen today. Patient vomited this morning. Patient is having trouble keeping water down- she swallows it and then comes back up- like a reflux. Patient reports she had COVID exposure 8 days ago. Advised ED for evaluation of throat.   Reason for Disposition . SEVERE symptoms of pill stuck in throat or esophagus (e.g., severe pain, bleeding, or inability to swallow liquids)  Answer Assessment - Initial Assessment Questions 1. SYMPTOM: "Are you having difficulty swallowing liquids, solids, or both?"     Patient swallowed a piece of pork chop last night that was too large and it scraped her throat. Today she feels irritation and possible swelling in throat- no pain. When she swallows water it going down- but come back up like a reflux reaction. 2. ONSET: "When did the swallowing problems begin?"      This morning 3. CAUSE: "What do you think is causing the problem?"      Possible throat injury from food 4. CHRONIC/RECURRENT: "Is this a new problem for you?"  If no, ask: "How long have you had this problem?" (e.g., days, weeks, months)      new 5. OTHER SYMPTOMS: "Do you have any other symptoms?" (e.g., difficulty breathing, sore throat, swollen tongue, chest pain)     No other problems 6. PREGNANCY: "Is there any chance you are pregnant?" "When was your last menstrual period?"     n/a  Protocols used: SWALLOWING DIFFICULTY-A-AH

## 2020-10-05 ENCOUNTER — Telehealth: Payer: Self-pay | Admitting: Family Medicine

## 2020-10-05 NOTE — Telephone Encounter (Signed)
Left message for patient to call back and schedule Medicare Annual Wellness Visit (AWV) either virtually or in office. Whichever the patients preference is.  Last AWV 12/30/18; please schedule at anytime with [facility] Health Advisor.  This should be a 40 minute visit.

## 2021-02-08 ENCOUNTER — Telehealth: Payer: Self-pay | Admitting: Family Medicine

## 2021-02-08 NOTE — Telephone Encounter (Signed)
Copied from [place] [id number]. Topic: Medicare AWV >> Feb 08, 2021 10:07 AM Cher Nakai R wrote: Reason for CRM:  Left message for patient to call back and schedule Medicare Annual Wellness Visit (AWV) in office.   If unable to come into the office for AWV,  please offer to do virtually or by telephone.  Last AWV:   12/30/2018  Please schedule at anytime with [facility] Health Advisor.  40 minute appointment  Any questions, please contact me at [phone]

## 2021-03-01 ENCOUNTER — Ambulatory Visit (INDEPENDENT_AMBULATORY_CARE_PROVIDER_SITE_OTHER): Payer: Medicare Other | Admitting: Family Medicine

## 2021-03-01 ENCOUNTER — Other Ambulatory Visit: Payer: Self-pay

## 2021-03-01 ENCOUNTER — Encounter: Payer: Self-pay | Admitting: Family Medicine

## 2021-03-01 VITALS — BP 118/70 | HR 80 | Ht 59.0 in | Wt 119.0 lb

## 2021-03-01 DIAGNOSIS — F5101 Primary insomnia: Secondary | ICD-10-CM | POA: Diagnosis not present

## 2021-03-01 DIAGNOSIS — K2100 Gastro-esophageal reflux disease with esophagitis, without bleeding: Secondary | ICD-10-CM

## 2021-03-01 DIAGNOSIS — F32A Depression, unspecified: Secondary | ICD-10-CM | POA: Diagnosis not present

## 2021-03-01 DIAGNOSIS — F419 Anxiety disorder, unspecified: Secondary | ICD-10-CM

## 2021-03-01 MED ORDER — PANTOPRAZOLE SODIUM 40 MG PO TBEC: 40.0000 mg | DELAYED_RELEASE_TABLET | Freq: Every day | ORAL | 1 refills | Status: DC

## 2021-03-01 MED ORDER — AMITRIPTYLINE HCL 50 MG PO TABS: 50.0000 mg | ORAL_TABLET | Freq: Every day | ORAL | 1 refills | Status: DC

## 2021-03-01 MED ORDER — CITALOPRAM HYDROBROMIDE 20 MG PO TABS: 20.0000 mg | ORAL_TABLET | Freq: Every day | ORAL | 1 refills | Status: DC

## 2021-03-01 NOTE — Progress Notes (Signed)
Date:  03/01/2021   Name:  Michelle Willis   DOB:  10/18/1950   MRN:  [id number]   Chief Complaint: Gastroesophageal Reflux, Depression, and Insomnia  Gastroesophageal Reflux She reports no abdominal pain, no chest pain, no choking, no coughing, no dysphagia, no heartburn, no nausea, no sore throat or no wheezing. This is a chronic problem. The current episode started more than 1 year ago. The problem occurs rarely. The problem has been gradually improving. The symptoms are aggravated by certain foods. Pertinent negatives include no anemia, fatigue, melena, muscle weakness, orthopnea or weight loss. There are no known risk factors. She has tried a PPI for the symptoms. The treatment provided moderate relief.  Depression        This is a chronic problem.  The current episode started more than 1 year ago.   The onset quality is gradual.   The problem has been gradually improving since onset.  Associated symptoms include insomnia.  Associated symptoms include no decreased concentration, no fatigue, no helplessness, no hopelessness, not irritable, no restlessness, no decreased interest, no appetite change, no body aches, no myalgias, no headaches, no indigestion, not sad and no suicidal ideas.  Past treatments include SSRIs - Selective serotonin reuptake inhibitors.  Compliance with treatment is variable.  Previous treatment provided moderate relief. Insomnia Primary symptoms: no fragmented sleep, no sleep disturbance, no difficulty falling asleep.   The problem has been gradually improving since onset. PMH includes: depression.    Lab Results  Component Value Date   CREATININE 0.81 08/31/2020   BUN 20 08/31/2020   NA 143 08/31/2020   K 4.7 08/31/2020   CL 105 08/31/2020   CO2 24 08/31/2020   Lab Results  Component Value Date   CHOL 211 (H) 08/31/2020   HDL 61 08/31/2020   LDLCALC 117 (H) 08/31/2020   TRIG 193 (H) 08/31/2020   No results found for: TSH No results found for: HGBA1C No  results found for: WBC, HGB, HCT, MCV, PLT No results found for: ALT, AST, GGT, ALKPHOS, BILITOT   Review of Systems  Constitutional:  Negative for appetite change, chills, fatigue, fever and weight loss.  HENT:  Negative for drooling, ear discharge, ear pain and sore throat.   Respiratory:  Negative for cough, choking, shortness of breath and wheezing.   Cardiovascular:  Negative for chest pain, palpitations and leg swelling.  Gastrointestinal:  Negative for abdominal pain, blood in stool, constipation, diarrhea, dysphagia, heartburn, melena and nausea.  Endocrine: Negative for polydipsia.  Genitourinary:  Negative for dysuria, frequency, hematuria and urgency.  Musculoskeletal:  Negative for back pain, myalgias, muscle weakness and neck pain.  Skin:  Negative for rash.  Allergic/Immunologic: Negative for environmental allergies.  Neurological:  Negative for dizziness and headaches.  Hematological:  Does not bruise/bleed easily.  Psychiatric/Behavioral:  Positive for depression. Negative for decreased concentration, sleep disturbance and suicidal ideas. The patient has insomnia. The patient is not nervous/anxious.    Patient Active Problem List   Diagnosis Date Noted   Gastroesophageal reflux disease with esophagitis without hemorrhage 09/06/2019   Problems with swallowing and mastication    Stricture and stenosis of esophagus    Encounter for screening colonoscopy    Primary insomnia 03/13/2017   Essential hypertension 09/30/2016   Anxiety and depression 09/30/2016   Major depressive disorder, recurrent episode, moderate ([place]) 03/28/2015    No Known Allergies  Past Surgical History:  Procedure Laterality Date   ABDOMINAL HYSTERECTOMY     CHOLECYSTECTOMY  COLONOSCOPY WITH PROPOFOL N/A 06/24/2019   Procedure: COLONOSCOPY WITH PROPOFOL;  Surgeon: Lucilla Lame, MD;  Location: [facility];  Service: Endoscopy;  Laterality: N/A;   ESOPHAGOGASTRODUODENOSCOPY (EGD) WITH  PROPOFOL N/A 07/16/2019   Procedure: ESOPHAGOGASTRODUODENOSCOPY (EGD) WITH DILATION;  Surgeon: Lucilla Lame, MD;  Location: [place];  Service: Endoscopy;  Laterality: N/A;   VAGINAL HYSTERECTOMY      Social History   Tobacco Use   Smoking status: Never   Smokeless tobacco: Never  Vaping Use   Vaping Use: Never used  Substance Use Topics   Alcohol use: No    Alcohol/week: 0.0 standard drinks   Drug use: No     Medication list has been reviewed and updated.  Current Meds  Medication Sig   amitriptyline (ELAVIL) 50 MG tablet Take 1 tablet (50 mg total) by mouth at bedtime.   Ascorbic Acid (VITAMIN C ADULT GUMMIES PO)    Calcium Carbonate (CALCIUM 600 PO) Take 1 tablet by mouth daily.   Cholecalciferol (VITAMIN D3 ADULT GUMMIES PO) Take 1 tablet by mouth daily.    citalopram (CELEXA) 20 MG tablet Take 1 tablet (20 mg total) by mouth daily.   pantoprazole (PROTONIX) 40 MG tablet Take 1 tablet (40 mg total) by mouth daily.   [DISCONTINUED] cephALEXin (KEFLEX) 500 MG capsule Take 1 capsule (500 mg total) by mouth 3 (three) times daily.    PHQ 2/9 Scores 03/01/2021 08/31/2020 03/14/2020 09/06/2019  PHQ - 2 Score 0 0 0 0  PHQ- 9 Score 0 0 0 0    GAD 7 : Generalized Anxiety Score 03/01/2021 03/14/2020 09/06/2019 06/03/2019  Nervous, Anxious, on Edge 0 0 0 0  Control/stop worrying 0 0 0 0  Worry too much - different things 0 0 0 0  Trouble relaxing 0 0 0 0  Restless 0 0 0 0  Easily annoyed or irritable 0 0 0 0  Afraid - awful might happen 0 0 0 0  Total GAD 7 Score 0 0 0 0    BP Readings from Last 3 Encounters:  03/01/21 118/70  08/31/20 120/80  03/14/20 130/82    Physical Exam Vitals and nursing note reviewed.  Constitutional:      General: She is not irritable.She is not in acute distress.    Appearance: She is not diaphoretic.  HENT:     Head: Normocephalic and atraumatic.     Right Ear: Tympanic membrane, ear canal and external ear normal.     Left Ear:  Tympanic membrane, ear canal and external ear normal.     Nose: Nose normal. No congestion or rhinorrhea.  Eyes:     General:        Right eye: No discharge.        Left eye: No discharge.     Conjunctiva/sclera: Conjunctivae normal.     Pupils: Pupils are equal, round, and reactive to light.  Neck:     Thyroid: No thyromegaly.     Vascular: No JVD.  Cardiovascular:     Rate and Rhythm: Normal rate and regular rhythm.     Heart sounds: Normal heart sounds. No murmur heard.   No friction rub. No gallop.  Pulmonary:     Effort: Pulmonary effort is normal.     Breath sounds: Normal breath sounds. No wheezing or rhonchi.  Abdominal:     General: Bowel sounds are normal.     Palpations: Abdomen is soft. There is no mass.     Tenderness: There  is no abdominal tenderness. There is no guarding.  Musculoskeletal:        General: Normal range of motion.     Cervical back: Normal range of motion and neck supple.  Lymphadenopathy:     Cervical: No cervical adenopathy.  Skin:    General: Skin is warm and dry.  Neurological:     Mental Status: She is alert.     Deep Tendon Reflexes: Reflexes are normal and symmetric.    Wt Readings from Last 3 Encounters:  03/01/21 119 lb (54 kg)  08/31/20 121 lb (54.9 kg)  03/14/20 131 lb (59.4 kg)    BP 118/70   Pulse 80   Ht 4\' 11"  (1.499 m)   Wt 119 lb (54 kg)   BMI 24.04 kg/m   Assessment and Plan: 1. Anxiety and depression Chronic.  Controlled.  Stable.  Uncomplicated.  PHQ is 0.  Gad score 0.  Patient is doing well currently on dosing of citalopram 20 mg once a day and we will continue with current dosing and will recheck in 6 months. - citalopram (CELEXA) 20 MG tablet; Take 1 tablet (20 mg total) by mouth daily.  Dispense: 90 tablet; Refill: 1  2. Primary insomnia Chronic.  Controlled.  Stable.  Continue amitriptyline 50 mg 1 nightly. - amitriptyline (ELAVIL) 50 MG tablet; Take 1 tablet (50 mg total) by mouth at bedtime.  Dispense: 90  tablet; Refill: 1  3. Gastroesophageal reflux disease with esophagitis without hemorrhage Chronic.  Controlled.  Stable.  Patient relates no dysphagia nor heartburn symptomatology.  We will continue pantoprazole 40 mg once a day. - pantoprazole (PROTONIX) 40 MG tablet; Take 1 tablet (40 mg total) by mouth daily.  Dispense: 90 tablet; Refill: 1

## 2021-03-05 ENCOUNTER — Other Ambulatory Visit: Payer: Self-pay

## 2021-03-05 ENCOUNTER — Encounter: Payer: Self-pay | Admitting: Family Medicine

## 2021-03-05 ENCOUNTER — Ambulatory Visit (INDEPENDENT_AMBULATORY_CARE_PROVIDER_SITE_OTHER): Payer: Medicare Other | Admitting: Family Medicine

## 2021-03-05 VITALS — BP 120/70 | HR 64 | Ht 59.0 in | Wt 120.0 lb

## 2021-03-05 DIAGNOSIS — N3090 Cystitis, unspecified without hematuria: Secondary | ICD-10-CM

## 2021-03-05 LAB — POCT URINALYSIS DIPSTICK
Bilirubin, UA: NEGATIVE
Glucose, UA: NEGATIVE
Ketones, UA: NEGATIVE
Nitrite, UA: NEGATIVE
Protein, UA: POSITIVE — AB
Spec Grav, UA: 1.02 (ref 1.010–1.025)
Urobilinogen, UA: 0.2 E.U./dL
pH, UA: 6 (ref 5.0–8.0)

## 2021-03-05 MED ORDER — CEPHALEXIN 500 MG PO CAPS: 500.0000 mg | ORAL_CAPSULE | Freq: Three times a day (TID) | ORAL | 0 refills | Status: DC

## 2021-03-05 NOTE — Progress Notes (Signed)
Date:  03/05/2021   Name:  Michelle Willis   DOB:  06-10-1951   MRN:  [id number]   Chief Complaint: Urinary Tract Infection (Burning and pressure when urinating. Can see some blood in urine- started yesterday morning)  Urinary Tract Infection  This is a new problem. The current episode started yesterday. The problem occurs every urination. The problem has been gradually worsening. The quality of the pain is described as burning. The pain is moderate. Associated symptoms include frequency and urgency. Pertinent negatives include no chills, hematuria or nausea. She has tried increased fluids for the symptoms. The treatment provided mild relief.   Lab Results  Component Value Date   CREATININE 0.81 08/31/2020   BUN 20 08/31/2020   NA 143 08/31/2020   K 4.7 08/31/2020   CL 105 08/31/2020   CO2 24 08/31/2020   Lab Results  Component Value Date   CHOL 211 (H) 08/31/2020   HDL 61 08/31/2020   LDLCALC 117 (H) 08/31/2020   TRIG 193 (H) 08/31/2020   No results found for: TSH No results found for: HGBA1C No results found for: WBC, HGB, HCT, MCV, PLT No results found for: ALT, AST, GGT, ALKPHOS, BILITOT   Review of Systems  Constitutional:  Negative for chills and fever.  HENT:  Negative for drooling, ear discharge, ear pain and sore throat.   Respiratory:  Negative for cough, shortness of breath and wheezing.   Cardiovascular:  Negative for chest pain, palpitations and leg swelling.  Gastrointestinal:  Negative for abdominal pain, blood in stool, constipation, diarrhea and nausea.  Endocrine: Negative for polydipsia.  Genitourinary:  Positive for frequency and urgency. Negative for dysuria and hematuria.  Musculoskeletal:  Negative for back pain, myalgias and neck pain.  Skin:  Negative for rash.  Allergic/Immunologic: Negative for environmental allergies.  Neurological:  Negative for dizziness and headaches.  Hematological:  Does not bruise/bleed easily.  Psychiatric/Behavioral:   Negative for suicidal ideas. The patient is not nervous/anxious.    Patient Active Problem List   Diagnosis Date Noted   Gastroesophageal reflux disease with esophagitis without hemorrhage 09/06/2019   Problems with swallowing and mastication    Stricture and stenosis of esophagus    Encounter for screening colonoscopy    Primary insomnia 03/13/2017   Essential hypertension 09/30/2016   Anxiety and depression 09/30/2016   Major depressive disorder, recurrent episode, moderate ([place]) 03/28/2015    No Known Allergies  Past Surgical History:  Procedure Laterality Date   ABDOMINAL HYSTERECTOMY     CHOLECYSTECTOMY     COLONOSCOPY WITH PROPOFOL N/A 06/24/2019   Procedure: COLONOSCOPY WITH PROPOFOL;  Surgeon: Lucilla Lame, MD;  Location: [place] [facility];  Service: Endoscopy;  Laterality: N/A;   ESOPHAGOGASTRODUODENOSCOPY (EGD) WITH PROPOFOL N/A 07/16/2019   Procedure: ESOPHAGOGASTRODUODENOSCOPY (EGD) WITH DILATION;  Surgeon: Lucilla Lame, MD;  Location: [facility];  Service: Endoscopy;  Laterality: N/A;   VAGINAL HYSTERECTOMY      Social History   Tobacco Use   Smoking status: Never   Smokeless tobacco: Never  Vaping Use   Vaping Use: Never used  Substance Use Topics   Alcohol use: No    Alcohol/week: 0.0 standard drinks   Drug use: No     Medication list has been reviewed and updated.  Current Meds  Medication Sig   amitriptyline (ELAVIL) 50 MG tablet Take 1 tablet (50 mg total) by mouth at bedtime.   Ascorbic Acid (VITAMIN C ADULT GUMMIES PO)    Calcium Carbonate (  CALCIUM 600 PO) Take 1 tablet by mouth daily.   Cholecalciferol (VITAMIN D3 ADULT GUMMIES PO) Take 1 tablet by mouth daily.    citalopram (CELEXA) 20 MG tablet Take 1 tablet (20 mg total) by mouth daily.   pantoprazole (PROTONIX) 40 MG tablet Take 1 tablet (40 mg total) by mouth daily.    PHQ 2/9 Scores 03/01/2021 08/31/2020 03/14/2020 09/06/2019  PHQ - 2 Score 0 0 0 0  PHQ- 9 Score 0 0 0 0    GAD  7 : Generalized Anxiety Score 03/01/2021 03/14/2020 09/06/2019 06/03/2019  Nervous, Anxious, on Edge 0 0 0 0  Control/stop worrying 0 0 0 0  Worry too much - different things 0 0 0 0  Trouble relaxing 0 0 0 0  Restless 0 0 0 0  Easily annoyed or irritable 0 0 0 0  Afraid - awful might happen 0 0 0 0  Total GAD 7 Score 0 0 0 0    BP Readings from Last 3 Encounters:  03/05/21 120/70  03/01/21 118/70  08/31/20 120/80    Physical Exam Vitals and nursing note reviewed.  Constitutional:      General: She is not in acute distress.    Appearance: She is not diaphoretic.  HENT:     Head: Normocephalic and atraumatic.     Right Ear: Tympanic membrane and external ear normal.     Left Ear: Tympanic membrane and external ear normal.     Nose: Nose normal. No congestion or rhinorrhea.     Mouth/Throat:     Mouth: Mucous membranes are moist.  Eyes:     General:        Right eye: No discharge.        Left eye: No discharge.     Conjunctiva/sclera: Conjunctivae normal.     Pupils: Pupils are equal, round, and reactive to light.  Neck:     Thyroid: No thyromegaly.     Vascular: No JVD.  Cardiovascular:     Rate and Rhythm: Normal rate and regular rhythm.     Heart sounds: Normal heart sounds. No murmur heard.   No friction rub. No gallop.  Pulmonary:     Effort: Pulmonary effort is normal.     Breath sounds: Normal breath sounds. No wheezing or rhonchi.  Abdominal:     General: Bowel sounds are normal.     Palpations: Abdomen is soft. There is no mass.     Tenderness: There is no abdominal tenderness. There is no guarding.  Musculoskeletal:        General: Normal range of motion.     Cervical back: Normal range of motion and neck supple.  Lymphadenopathy:     Cervical: No cervical adenopathy.  Skin:    General: Skin is warm and dry.  Neurological:     Mental Status: She is alert.     Deep Tendon Reflexes: Reflexes are normal and symmetric.    Wt Readings from Last 3 Encounters:   03/05/21 120 lb (54.4 kg)  03/01/21 119 lb (54 kg)  08/31/20 121 lb (54.9 kg)    BP 120/70   Pulse 64   Ht 4\' 11"  (1.499 m)   Wt 120 lb (54.4 kg)   BMI 24.24 kg/m   Assessment and Plan:  1. Cystitis New onset.  Stable.  Uncomplicated.  Patient's began having symptomatology of dysuria urgency and frequency on Sunday morning.  Urinalysis is consistent with a UTI and we will treat with cephalexin  500 mg 3 times a day for 5 days. - POCT urinalysis dipstick - cephALEXin (KEFLEX) 500 MG capsule; Take 1 capsule (500 mg total) by mouth 3 (three) times daily.  Dispense: 15 capsule; Refill: 0

## 2021-05-01 ENCOUNTER — Telehealth: Payer: Self-pay | Admitting: Family Medicine

## 2021-05-01 NOTE — Telephone Encounter (Signed)
Copied from [place] [phone]. Topic: Medicare AWV >> May 01, 2021  4:43 PM Cher Nakai R wrote: Reason for CRM:  Left message for patient to call back and schedule Medicare Annual Wellness Visit (AWV) in office.   If unable to come into the office for AWV,  please offer to do virtually or by telephone.  Last AWV: 12/30/2018  Please schedule at anytime with [facility] Health Advisor.  40 minute appointment  Any questions, please contact me at [phone]

## 2021-05-11 ENCOUNTER — Telehealth: Payer: Self-pay

## 2021-05-11 ENCOUNTER — Other Ambulatory Visit: Payer: Self-pay

## 2021-05-11 ENCOUNTER — Telehealth: Payer: Self-pay | Admitting: *Deleted

## 2021-05-11 NOTE — Telephone Encounter (Signed)
Left message for pt to call back to go over health maintenance

## 2021-05-11 NOTE — Telephone Encounter (Signed)
LMOM for patient to call office to schedule her AWV. Office number provided on voicemail

## 2021-07-01 ENCOUNTER — Other Ambulatory Visit: Payer: Self-pay | Admitting: Family Medicine

## 2021-07-01 DIAGNOSIS — F5101 Primary insomnia: Secondary | ICD-10-CM

## 2021-07-01 NOTE — Telephone Encounter (Signed)
Requested Prescriptions  Pending Prescriptions Disp Refills  . amitriptyline (ELAVIL) 50 MG tablet [Pharmacy Med Name: Amitriptyline HCl 50 MG Oral Tablet] 90 tablet 0    Sig: TAKE 1 TABLET BY MOUTH AT BEDTIME     Psychiatry:  Antidepressants - Heterocyclics (TCAs) Passed - 07/01/2021  6:18 AM      Passed - Completed PHQ-2 or PHQ-9 in the last 360 days      Passed - Valid encounter within last 6 months    Recent Outpatient Visits          3 months ago Cystitis   [place], MD   4 months ago Anxiety and depression   [facility] Juline Patch, MD   10 months ago [facility] Juline Patch, MD   1 year ago Anxiety and depression   [facility] Juline Patch, MD   1 year ago Primary insomnia   [facility] Juline Patch, MD      Future Appointments            In 2 months Juline Patch, MD [facility], [facility]

## 2021-07-13 ENCOUNTER — Encounter: Payer: Self-pay | Admitting: Family Medicine

## 2021-07-13 ENCOUNTER — Ambulatory Visit (INDEPENDENT_AMBULATORY_CARE_PROVIDER_SITE_OTHER): Payer: Medicare Other | Admitting: Family Medicine

## 2021-07-13 ENCOUNTER — Other Ambulatory Visit: Payer: Self-pay

## 2021-07-13 VITALS — BP 130/80 | HR 82 | Ht 59.0 in | Wt 117.8 lb

## 2021-07-13 DIAGNOSIS — Z23 Encounter for immunization: Secondary | ICD-10-CM

## 2021-07-13 DIAGNOSIS — F419 Anxiety disorder, unspecified: Secondary | ICD-10-CM

## 2021-07-13 DIAGNOSIS — F32A Depression, unspecified: Secondary | ICD-10-CM | POA: Diagnosis not present

## 2021-07-13 MED ORDER — BUSPIRONE HCL 7.5 MG PO TABS: 7.5000 mg | ORAL_TABLET | Freq: Two times a day (BID) | ORAL | 2 refills | Status: DC

## 2021-07-13 MED ORDER — CITALOPRAM HYDROBROMIDE 20 MG PO TABS: 20.0000 mg | ORAL_TABLET | Freq: Every day | ORAL | 1 refills | Status: DC

## 2021-07-13 NOTE — Progress Notes (Signed)
Date:  07/13/2021   Name:  Michelle Willis   DOB:  12-Jul-1951   MRN:  [id number]   Chief Complaint: No chief complaint on file.  Anxiety Presents for follow-up visit. Symptoms include excessive worry and nervous/anxious behavior. Patient reports no chest pain, compulsions, confusion, decreased concentration, depressed mood, dizziness, dry mouth, feeling of choking, hyperventilation, impotence, insomnia, irritability, malaise, muscle tension, nausea, obsessions, palpitations, panic, restlessness, shortness of breath or suicidal ideas.     Lab Results  Component Value Date   CREATININE 0.81 08/31/2020   BUN 20 08/31/2020   NA 143 08/31/2020   K 4.7 08/31/2020   CL 105 08/31/2020   CO2 24 08/31/2020   Lab Results  Component Value Date   CHOL 211 (H) 08/31/2020   HDL 61 08/31/2020   LDLCALC 117 (H) 08/31/2020   TRIG 193 (H) 08/31/2020   No results found for: TSH No results found for: HGBA1C No results found for: WBC, HGB, HCT, MCV, PLT No results found for: ALT, AST, GGT, ALKPHOS, BILITOT No components found for: VITD  Review of Systems  Constitutional:  Negative for chills, fever and irritability.  HENT:  Negative for drooling, ear discharge, ear pain and sore throat.   Respiratory:  Negative for cough, shortness of breath and wheezing.   Cardiovascular:  Negative for chest pain, palpitations and leg swelling.  Gastrointestinal:  Negative for abdominal pain, blood in stool, constipation, diarrhea and nausea.  Endocrine: Negative for polydipsia.  Genitourinary:  Negative for dysuria, frequency, hematuria, impotence and urgency.  Musculoskeletal:  Negative for back pain, myalgias and neck pain.  Skin:  Negative for rash.  Allergic/Immunologic: Negative for environmental allergies.  Neurological:  Negative for dizziness and headaches.  Hematological:  Does not bruise/bleed easily.  Psychiatric/Behavioral:  Negative for confusion, decreased concentration and suicidal ideas.  The patient is nervous/anxious. The patient does not have insomnia.    Patient Active Problem List   Diagnosis Date Noted   Gastroesophageal reflux disease with esophagitis without hemorrhage 09/06/2019   Problems with swallowing and mastication    Stricture and stenosis of esophagus    Encounter for screening colonoscopy    Primary insomnia 03/13/2017   Essential hypertension 09/30/2016   Anxiety and depression 09/30/2016   Major depressive disorder, recurrent episode, moderate ([place]) 03/28/2015    No Known Allergies  Past Surgical History:  Procedure Laterality Date   ABDOMINAL HYSTERECTOMY     CHOLECYSTECTOMY     COLONOSCOPY WITH PROPOFOL N/A 06/24/2019   Procedure: COLONOSCOPY WITH PROPOFOL;  Surgeon: Lucilla Lame, MD;  Location: [place];  Service: Endoscopy;  Laterality: N/A;   ESOPHAGOGASTRODUODENOSCOPY (EGD) WITH PROPOFOL N/A 07/16/2019   Procedure: ESOPHAGOGASTRODUODENOSCOPY (EGD) WITH DILATION;  Surgeon: Lucilla Lame, MD;  Location: [place];  Service: Endoscopy;  Laterality: N/A;   VAGINAL HYSTERECTOMY      Social History   Tobacco Use   Smoking status: Never   Smokeless tobacco: Never  Vaping Use   Vaping Use: Never used  Substance Use Topics   Alcohol use: No    Alcohol/week: 0.0 standard drinks   Drug use: No     Medication list has been reviewed and updated.  Current Meds  Medication Sig   amitriptyline (ELAVIL) 50 MG tablet TAKE 1 TABLET BY MOUTH AT BEDTIME   Ascorbic Acid (VITAMIN C ADULT GUMMIES PO)    Calcium Carbonate (CALCIUM 600 PO) Take 1 tablet by mouth daily.   Cholecalciferol (VITAMIN D3 ADULT GUMMIES PO) Take 1  tablet by mouth daily.    citalopram (CELEXA) 20 MG tablet Take 1 tablet (20 mg total) by mouth daily.   pantoprazole (PROTONIX) 40 MG tablet Take 1 tablet (40 mg total) by mouth daily.    PHQ 2/9 Scores 07/13/2021 03/01/2021 08/31/2020 03/14/2020  PHQ - 2 Score 0 0 0 0  PHQ- 9 Score 7 0 0 0    GAD 7 :  Generalized Anxiety Score 07/13/2021 03/01/2021 03/14/2020 09/06/2019  Nervous, Anxious, on Edge - 0 0 0  Control/stop worrying 1 0 0 0  Worry too much - different things 1 0 0 0  Trouble relaxing 1 0 0 0  Restless 0 0 0 0  Easily annoyed or irritable 1 0 0 0  Afraid - awful might happen 0 0 0 0  Total GAD 7 Score - 0 0 0    BP Readings from Last 3 Encounters:  07/13/21 130/80  03/05/21 120/70  03/01/21 118/70    Physical Exam Vitals and nursing note reviewed.  Constitutional:      Appearance: She is well-developed.  HENT:     Head: Normocephalic.     Right Ear: Tympanic membrane, ear canal and external ear normal.     Left Ear: Tympanic membrane, ear canal and external ear normal.     Nose: Nose normal.  Eyes:     General: Lids are everted, no foreign bodies appreciated. No scleral icterus.       Left eye: No foreign body or hordeolum.     Conjunctiva/sclera: Conjunctivae normal.     Right eye: Right conjunctiva is not injected.     Left eye: Left conjunctiva is not injected.     Pupils: Pupils are equal, round, and reactive to light.  Neck:     Thyroid: No thyromegaly.     Vascular: No JVD.     Trachea: No tracheal deviation.  Cardiovascular:     Rate and Rhythm: Normal rate and regular rhythm.     Heart sounds: Normal heart sounds. No murmur heard.   No friction rub. No gallop.  Pulmonary:     Effort: Pulmonary effort is normal. No respiratory distress.     Breath sounds: Normal breath sounds. No wheezing, rhonchi or rales.  Abdominal:     General: Bowel sounds are normal.     Palpations: Abdomen is soft. There is no mass.     Tenderness: There is no abdominal tenderness. There is no guarding or rebound.  Musculoskeletal:        General: No tenderness. Normal range of motion.     Cervical back: Normal range of motion and neck supple.  Lymphadenopathy:     Cervical: No cervical adenopathy.  Skin:    General: Skin is warm.     Findings: No rash.  Neurological:      Mental Status: She is alert and oriented to person, place, and time.     Cranial Nerves: No cranial nerve deficit.     Deep Tendon Reflexes: Reflexes normal.  Psychiatric:        Mood and Affect: Mood is not anxious or depressed.    Wt Readings from Last 3 Encounters:  07/13/21 117 lb 12.8 oz (53.4 kg)  03/05/21 120 lb (54.4 kg)  03/01/21 119 lb (54 kg)    BP 130/80   Pulse 82   Ht 4\' 11"  (1.499 m)   Wt 117 lb 12.8 oz (53.4 kg)   BMI 23.79 kg/m   Assessment and Plan:  1. Anxiety and depression Chronic.  Controlled.  Stable.  We will continue citalopram 20 mg once a day.  But add buspirone 7.5 mg we will start at 1 a day and then progressed to twice a day if necessary.  Patient has taken buspirone in the past and she is done well as far as her anxiety with this is in the past as well - citalopram (CELEXA) 20 MG tablet; Take 1 tablet (20 mg total) by mouth daily.  Dispense: 90 tablet; Refill: 1 - busPIRone (BUSPAR) 7.5 MG tablet; Take 1 tablet (7.5 mg total) by mouth 2 (two) times daily.  Dispense: 60 tablet; Refill: 2  2. Need for immunization against influenza Discussed and administered - Flu Vaccine QUAD High Dose(Fluad)

## 2021-09-03 ENCOUNTER — Ambulatory Visit: Payer: Self-pay | Admitting: Family Medicine

## 2021-09-04 ENCOUNTER — Telehealth: Payer: Self-pay | Admitting: Family Medicine

## 2021-09-04 NOTE — Telephone Encounter (Signed)
Copied from [place] [phone]. Topic: Medicare AWV >> Sep 04, 2021  1:20 PM Cher Nakai R wrote: Reason for CRM:  Left message for patient to call back and schedule Medicare Annual Wellness Visit (AWV) in office.   If unable to come into the office for AWV,  please offer to do virtually or by telephone.  Last AWV: 12/30/2018  Please schedule at anytime with [facility] Health Advisor.      40 Minutes appointment   Any questions, please call me at [phone]

## 2021-09-05 ENCOUNTER — Ambulatory Visit (INDEPENDENT_AMBULATORY_CARE_PROVIDER_SITE_OTHER): Payer: Medicare Other | Admitting: Family Medicine

## 2021-09-05 ENCOUNTER — Other Ambulatory Visit: Payer: Self-pay

## 2021-09-05 ENCOUNTER — Encounter: Payer: Self-pay | Admitting: Family Medicine

## 2021-09-05 VITALS — BP 136/80 | HR 80 | Ht 59.0 in | Wt 123.0 lb

## 2021-09-05 DIAGNOSIS — F32A Depression, unspecified: Secondary | ICD-10-CM | POA: Diagnosis not present

## 2021-09-05 DIAGNOSIS — K2100 Gastro-esophageal reflux disease with esophagitis, without bleeding: Secondary | ICD-10-CM | POA: Diagnosis not present

## 2021-09-05 DIAGNOSIS — R69 Illness, unspecified: Secondary | ICD-10-CM | POA: Diagnosis not present

## 2021-09-05 DIAGNOSIS — E782 Mixed hyperlipidemia: Secondary | ICD-10-CM

## 2021-09-05 DIAGNOSIS — F5101 Primary insomnia: Secondary | ICD-10-CM

## 2021-09-05 DIAGNOSIS — F419 Anxiety disorder, unspecified: Secondary | ICD-10-CM | POA: Diagnosis not present

## 2021-09-05 MED ORDER — PANTOPRAZOLE SODIUM 40 MG PO TBEC: 40.0000 mg | DELAYED_RELEASE_TABLET | Freq: Every day | ORAL | 1 refills | Status: DC

## 2021-09-05 MED ORDER — CITALOPRAM HYDROBROMIDE 20 MG PO TABS: 20.0000 mg | ORAL_TABLET | Freq: Every day | ORAL | 1 refills | Status: DC

## 2021-09-05 MED ORDER — AMITRIPTYLINE HCL 50 MG PO TABS: 50.0000 mg | ORAL_TABLET | Freq: Every day | ORAL | 1 refills | Status: DC

## 2021-09-05 MED ORDER — BUSPIRONE HCL 7.5 MG PO TABS: 7.5000 mg | ORAL_TABLET | Freq: Two times a day (BID) | ORAL | 1 refills | Status: DC

## 2021-09-05 NOTE — Progress Notes (Signed)
Date:  09/05/2021   Name:  Michelle Willis   DOB:  Jul 04, 1951   MRN:  [id number]   Chief Complaint: Depression, Anxiety, Gastroesophageal Reflux, and Insomnia  Depression        This is a chronic problem.  The current episode started more than 1 year ago.   The onset quality is gradual.   The problem occurs rarely.  The problem has been waxing and waning since onset.  Associated symptoms include no decreased concentration, no fatigue, no helplessness, no hopelessness, does not have insomnia, not irritable, no restlessness, no decreased interest, no appetite change, no body aches, no myalgias, no headaches, no indigestion, not sad and no suicidal ideas.     The symptoms are aggravated by nothing.  Past treatments include SSRIs - Selective serotonin reuptake inhibitors.  Compliance with treatment is good.  Past medical history includes anxiety.   Anxiety Presents for follow-up visit. Patient reports no chest pain, compulsions, confusion, decreased concentration, depressed mood, dizziness, dry mouth, excessive worry, hyperventilation, impotence, insomnia, irritability, muscle tension, nausea, nervous/anxious behavior, obsessions, palpitations, panic, restlessness, shortness of breath or suicidal ideas. Symptoms occur occasionally. The severity of symptoms is mild. The quality of sleep is good.    Gastroesophageal Reflux She reports no abdominal pain, no belching, no chest pain, no choking, no coughing, no dysphagia, no early satiety, no globus sensation, no heartburn, no hoarse voice, no nausea, no sore throat, no stridor or no wheezing. This is a chronic problem. The current episode started more than 1 year ago. The problem has been gradually improving. Pertinent negatives include no fatigue.  Insomnia PMH includes: depression.    Lab Results  Component Value Date   NA 143 08/31/2020   K 4.7 08/31/2020   CO2 24 08/31/2020   GLUCOSE 88 08/31/2020   BUN 20 08/31/2020   CREATININE 0.81  08/31/2020   CALCIUM 9.6 08/31/2020   GFRNONAA 73 08/31/2020   Lab Results  Component Value Date   CHOL 211 (H) 08/31/2020   HDL 61 08/31/2020   LDLCALC 117 (H) 08/31/2020   TRIG 193 (H) 08/31/2020   No results found for: TSH No results found for: HGBA1C No results found for: WBC, HGB, HCT, MCV, PLT No results found for: ALT, AST, GGT, ALKPHOS, BILITOT No results found for: 25OHVITD2, 25OHVITD3, VD25OH   Review of Systems  Constitutional:  Negative for appetite change, chills, fatigue, fever and irritability.  HENT:  Negative for drooling, ear discharge, ear pain, hoarse voice and sore throat.   Respiratory:  Negative for cough, choking, shortness of breath and wheezing.   Cardiovascular:  Negative for chest pain, palpitations and leg swelling.  Gastrointestinal:  Negative for abdominal pain, blood in stool, constipation, diarrhea, dysphagia, heartburn and nausea.  Endocrine: Negative for polydipsia.  Genitourinary:  Negative for dysuria, frequency, hematuria, impotence and urgency.  Musculoskeletal:  Negative for back pain, myalgias and neck pain.  Skin:  Negative for rash.  Allergic/Immunologic: Negative for environmental allergies.  Neurological:  Negative for dizziness and headaches.  Hematological:  Does not bruise/bleed easily.  Psychiatric/Behavioral:  Positive for depression. Negative for confusion, decreased concentration and suicidal ideas. The patient is not nervous/anxious and does not have insomnia.    Patient Active Problem List   Diagnosis Date Noted   Gastroesophageal reflux disease with esophagitis without hemorrhage 09/06/2019   Problems with swallowing and mastication    Stricture and stenosis of esophagus    Encounter for screening colonoscopy    Primary insomnia  03/13/2017   Essential hypertension 09/30/2016   Anxiety and depression 09/30/2016   Major depressive disorder, recurrent episode, moderate ([place]) 03/28/2015    No Known Allergies  Past  Surgical History:  Procedure Laterality Date   ABDOMINAL HYSTERECTOMY     CHOLECYSTECTOMY     COLONOSCOPY WITH PROPOFOL N/A 06/24/2019   Procedure: COLONOSCOPY WITH PROPOFOL;  Surgeon: Lucilla Lame, MD;  Location: [facility];  Service: Endoscopy;  Laterality: N/A;   ESOPHAGOGASTRODUODENOSCOPY (EGD) WITH PROPOFOL N/A 07/16/2019   Procedure: ESOPHAGOGASTRODUODENOSCOPY (EGD) WITH DILATION;  Surgeon: Lucilla Lame, MD;  Location: [facility];  Service: Endoscopy;  Laterality: N/A;   VAGINAL HYSTERECTOMY      Social History   Tobacco Use   Smoking status: Never   Smokeless tobacco: Never  Vaping Use   Vaping Use: Never used  Substance Use Topics   Alcohol use: No    Alcohol/week: 0.0 standard drinks   Drug use: No     Medication list has been reviewed and updated.  Current Meds  Medication Sig   amitriptyline (ELAVIL) 50 MG tablet TAKE 1 TABLET BY MOUTH AT BEDTIME   Ascorbic Acid (VITAMIN C ADULT GUMMIES PO)    busPIRone (BUSPAR) 7.5 MG tablet Take 1 tablet (7.5 mg total) by mouth 2 (two) times daily.   Calcium Carbonate (CALCIUM 600 PO) Take 1 tablet by mouth daily.   Cholecalciferol (VITAMIN D3 ADULT GUMMIES PO) Take 1 tablet by mouth daily.    citalopram (CELEXA) 20 MG tablet Take 1 tablet (20 mg total) by mouth daily.   pantoprazole (PROTONIX) 40 MG tablet Take 1 tablet (40 mg total) by mouth daily.    PHQ 2/9 Scores 09/05/2021 07/13/2021 03/01/2021 08/31/2020  PHQ - 2 Score 0 0 0 0  PHQ- 9 Score 0 7 0 0    GAD 7 : Generalized Anxiety Score 09/05/2021 07/13/2021 03/01/2021 03/14/2020  Nervous, Anxious, on Edge 0 - 0 0  Control/stop worrying 0 1 0 0  Worry too much - different things 0 1 0 0  Trouble relaxing 0 1 0 0  Restless 0 0 0 0  Easily annoyed or irritable 0 1 0 0  Afraid - awful might happen 0 0 0 0  Total GAD 7 Score 0 - 0 0  Anxiety Difficulty Not difficult at all - - -    BP Readings from Last 3 Encounters:  09/05/21 136/80  07/13/21 130/80   03/05/21 120/70    Physical Exam Vitals and nursing note reviewed. Exam conducted with a chaperone present.  Constitutional:      General: She is not irritable.She is not in acute distress.    Appearance: She is not diaphoretic.  HENT:     Head: Normocephalic and atraumatic.     Right Ear: Tympanic membrane, ear canal and external ear normal.     Left Ear: Tympanic membrane, ear canal and external ear normal.     Nose: Nose normal. No congestion or rhinorrhea.     Mouth/Throat:     Mouth: Mucous membranes are moist.     Pharynx: No oropharyngeal exudate or posterior oropharyngeal erythema.  Eyes:     General:        Right eye: No discharge.        Left eye: No discharge.     Conjunctiva/sclera: Conjunctivae normal.     Pupils: Pupils are equal, round, and reactive to light.  Neck:     Thyroid: No thyromegaly.     Vascular: No  JVD.  Cardiovascular:     Rate and Rhythm: Normal rate and regular rhythm.     Heart sounds: Normal heart sounds. No murmur heard.   No friction rub. No gallop.  Pulmonary:     Effort: Pulmonary effort is normal.     Breath sounds: Normal breath sounds. No wheezing or rhonchi.  Abdominal:     General: Bowel sounds are normal. There is no distension.     Palpations: Abdomen is soft. There is no mass.     Tenderness: There is no abdominal tenderness. There is no guarding.  Musculoskeletal:        General: Normal range of motion.     Cervical back: Normal range of motion and neck supple.  Lymphadenopathy:     Cervical: No cervical adenopathy.  Skin:    General: Skin is warm and dry.  Neurological:     Mental Status: She is alert.     Deep Tendon Reflexes: Reflexes are normal and symmetric.    Wt Readings from Last 3 Encounters:  09/05/21 123 lb (55.8 kg)  07/13/21 117 lb 12.8 oz (53.4 kg)  03/05/21 120 lb (54.4 kg)    BP 136/80    Pulse 80    Ht 4\' 11"  (1.499 m)    Wt 123 lb (55.8 kg)    BMI 24.84 kg/m   Assessment and Plan:  1. Anxiety  and depression Chronic.  Controlled.  Stable.  PHQ is 0 Gad score 0 continue buspirone 7.5 twice a day and citalopram 20 mg once a day.  We will recheck in 6 months. - busPIRone (BUSPAR) 7.5 MG tablet; Take 1 tablet (7.5 mg total) by mouth 2 (two) times daily.  Dispense: 180 tablet; Refill: 1 - citalopram (CELEXA) 20 MG tablet; Take 1 tablet (20 mg total) by mouth daily.  Dispense: 90 tablet; Refill: 1  2. Primary insomnia On it.  Controlled.  Stable.  Continue amitriptyline 50 mg nightly. - amitriptyline (ELAVIL) 50 MG tablet; Take 1 tablet (50 mg total) by mouth at bedtime.  Dispense: 90 tablet; Refill: 1  3. Gastroesophageal reflux disease with esophagitis without hemorrhage Chronic.  Controlled.  Stable.  Continue pantoprazole 40 mg once a day. - pantoprazole (PROTONIX) 40 MG tablet; Take 1 tablet (40 mg total) by mouth daily.  Dispense: 90 tablet; Refill: 1  4. Taking medication for chronic disease Patient is taking medication which may affect electrolytes and we will check renal function panel at this time for GFR and electrolyte levels. - Renal Function Panel  5. Mixed hyperlipidemia Patient noted to have elevated triglycerides and LDL in the past but is diet controlled we will recheck lipid panel for current status. - Lipid Panel With LDL/HDL Ratio

## 2021-09-11 ENCOUNTER — Telehealth: Payer: Self-pay

## 2021-09-11 NOTE — Telephone Encounter (Signed)
I called pt and left message for her to go get labs drawn this week that were ordered at last appt

## 2021-09-14 DIAGNOSIS — E782 Mixed hyperlipidemia: Secondary | ICD-10-CM | POA: Diagnosis not present

## 2021-09-14 DIAGNOSIS — R69 Illness, unspecified: Secondary | ICD-10-CM | POA: Diagnosis not present

## 2021-09-15 LAB — LIPID PANEL WITH LDL/HDL RATIO
Cholesterol, Total: 236 mg/dL — ABNORMAL HIGH (ref 100–199)
HDL: 73 mg/dL (ref >39)
LDL Chol Calc (NIH): 148 mg/dL — ABNORMAL HIGH (ref 0–99)
LDL/HDL Ratio: 2 ratio (ref 0.0–3.2)
Triglycerides: 88 mg/dL (ref 0–149)
VLDL Cholesterol Cal: 15 mg/dL (ref 5–40)

## 2021-09-15 LAB — RENAL FUNCTION PANEL
Albumin: 4.4 g/dL (ref 3.7–4.7)
BUN/Creatinine Ratio: 19 (ref 12–28)
BUN: 17 mg/dL (ref 8–27)
CO2: 24 mmol/L (ref 20–29)
Calcium: 9.5 mg/dL (ref 8.7–10.3)
Chloride: 104 mmol/L (ref 96–106)
Creatinine, Ser: 0.88 mg/dL (ref 0.57–1.00)
Glucose: 84 mg/dL (ref 70–99)
Phosphorus: 3.3 mg/dL (ref 3.0–4.3)
Potassium: 4.2 mmol/L (ref 3.5–5.2)
Sodium: 141 mmol/L (ref 134–144)
eGFR: 70 mL/min/{1.73_m2} (ref >59)

## 2021-09-17 ENCOUNTER — Other Ambulatory Visit: Payer: Self-pay

## 2021-09-17 DIAGNOSIS — E782 Mixed hyperlipidemia: Secondary | ICD-10-CM

## 2021-09-17 MED ORDER — EZETIMIBE 10 MG PO TABS: 10.0000 mg | ORAL_TABLET | Freq: Every day | ORAL | 1 refills | Status: DC

## 2022-03-05 ENCOUNTER — Ambulatory Visit: Payer: Medicare Other | Admitting: Family Medicine

## 2022-03-08 ENCOUNTER — Ambulatory Visit: Payer: Medicare Other | Admitting: Family Medicine

## 2022-03-12 ENCOUNTER — Ambulatory Visit (INDEPENDENT_AMBULATORY_CARE_PROVIDER_SITE_OTHER): Payer: Medicare Other | Admitting: Family Medicine

## 2022-03-12 ENCOUNTER — Encounter: Payer: Self-pay | Admitting: Family Medicine

## 2022-03-12 VITALS — BP 138/88 | HR 76 | Ht 59.0 in | Wt 125.0 lb

## 2022-03-12 DIAGNOSIS — F32A Depression, unspecified: Secondary | ICD-10-CM | POA: Diagnosis not present

## 2022-03-12 DIAGNOSIS — E782 Mixed hyperlipidemia: Secondary | ICD-10-CM

## 2022-03-12 DIAGNOSIS — F419 Anxiety disorder, unspecified: Secondary | ICD-10-CM

## 2022-03-12 DIAGNOSIS — K2100 Gastro-esophageal reflux disease with esophagitis, without bleeding: Secondary | ICD-10-CM | POA: Diagnosis not present

## 2022-03-12 DIAGNOSIS — F5101 Primary insomnia: Secondary | ICD-10-CM

## 2022-03-12 MED ORDER — AMITRIPTYLINE HCL 50 MG PO TABS: 50.0000 mg | ORAL_TABLET | Freq: Every day | ORAL | 1 refills | Status: DC

## 2022-03-12 MED ORDER — CITALOPRAM HYDROBROMIDE 20 MG PO TABS: 20.0000 mg | ORAL_TABLET | Freq: Every day | ORAL | 1 refills | Status: DC

## 2022-03-12 MED ORDER — EZETIMIBE 10 MG PO TABS: 10.0000 mg | ORAL_TABLET | Freq: Every day | ORAL | 1 refills | Status: DC

## 2022-03-12 MED ORDER — BUSPIRONE HCL 7.5 MG PO TABS: 7.5000 mg | ORAL_TABLET | Freq: Two times a day (BID) | ORAL | 1 refills | Status: DC

## 2022-03-12 MED ORDER — PANTOPRAZOLE SODIUM 40 MG PO TBEC: 40.0000 mg | DELAYED_RELEASE_TABLET | Freq: Every day | ORAL | 1 refills | Status: DC

## 2022-03-12 NOTE — Progress Notes (Signed)
Date:  03/12/2022   Name:  Michelle Willis   DOB:  1950-10-12   MRN:  [id number]   Chief Complaint: Gastroesophageal Reflux, Depression, and Anxiety  Gastroesophageal Reflux She reports no abdominal pain, no chest pain, no choking, no dysphagia, no heartburn, no nausea or no wheezing. This is a chronic problem. The current episode started more than 1 year ago. The problem has been gradually improving. Nothing aggravates the symptoms. Associated symptoms include fatigue. She has tried a PPI for the symptoms. The treatment provided moderate relief.  Depression        This is a chronic problem.  The current episode started more than 1 year ago.   The onset quality is gradual.   The problem occurs rarely.  Associated symptoms include fatigue.  Associated symptoms include no decreased concentration, no helplessness, no hopelessness, does not have insomnia, not irritable, no restlessness, no decreased interest, no appetite change, no body aches, no myalgias, no headaches, no indigestion, not sad and no suicidal ideas.  Past treatments include SSRIs - Selective serotonin reuptake inhibitors.  Compliance with treatment is good.  Previous treatment provided mild relief.  Past medical history includes anxiety.   Anxiety Presents for follow-up visit. Patient reports no chest pain, compulsions, confusion, decreased concentration, excessive worry, insomnia, irritability, muscle tension, nausea, nervous/anxious behavior, palpitations, panic, restlessness, shortness of breath or suicidal ideas. Symptoms occur rarely.    Hyperlipidemia This is a chronic problem. The current episode started more than 1 year ago. The problem is controlled. Recent lipid tests were reviewed and are normal. Pertinent negatives include no chest pain, myalgias or shortness of breath. Current antihyperlipidemic treatment includes ezetimibe.    Lab Results  Component Value Date   NA 141 09/14/2021   K 4.2 09/14/2021   CO2 24  09/14/2021   GLUCOSE 84 09/14/2021   BUN 17 09/14/2021   CREATININE 0.88 09/14/2021   CALCIUM 9.5 09/14/2021   EGFR 70 09/14/2021   GFRNONAA 73 08/31/2020   Lab Results  Component Value Date   CHOL 236 (H) 09/14/2021   HDL 73 09/14/2021   LDLCALC 148 (H) 09/14/2021   TRIG 88 09/14/2021   No results found for: "TSH" No results found for: "HGBA1C" No results found for: "WBC", "HGB", "HCT", "MCV", "PLT" No results found for: "ALT", "AST", "GGT", "ALKPHOS", "BILITOT" No results found for: "25OHVITD2", "25OHVITD3", "VD25OH"   Review of Systems  Constitutional:  Positive for fatigue. Negative for appetite change and irritability.  HENT:  Negative for sinus pressure.   Respiratory:  Negative for choking, shortness of breath and wheezing.   Cardiovascular:  Negative for chest pain, palpitations and leg swelling.  Gastrointestinal:  Negative for abdominal distention, abdominal pain, dysphagia, heartburn and nausea.  Genitourinary:  Negative for difficulty urinating.  Musculoskeletal:  Negative for myalgias.  Neurological:  Negative for headaches.  Psychiatric/Behavioral:  Positive for depression. Negative for confusion, decreased concentration and suicidal ideas. The patient is not nervous/anxious and does not have insomnia.     Patient Active Problem List   Diagnosis Date Noted   Gastroesophageal reflux disease with esophagitis without hemorrhage 09/06/2019   Problems with swallowing and mastication    Stricture and stenosis of esophagus    Encounter for screening colonoscopy    Primary insomnia 03/13/2017   Essential hypertension 09/30/2016   Anxiety and depression 09/30/2016   Major depressive disorder, recurrent episode, moderate ([place]) 03/28/2015    No Known Allergies  Past Surgical History:  Procedure Laterality Date  ABDOMINAL HYSTERECTOMY     CHOLECYSTECTOMY     COLONOSCOPY WITH PROPOFOL N/A 06/24/2019   Procedure: COLONOSCOPY WITH PROPOFOL;  Surgeon: Lucilla Lame,  MD;  Location: [facility];  Service: Endoscopy;  Laterality: N/A;   ESOPHAGOGASTRODUODENOSCOPY (EGD) WITH PROPOFOL N/A 07/16/2019   Procedure: ESOPHAGOGASTRODUODENOSCOPY (EGD) WITH DILATION;  Surgeon: Lucilla Lame, MD;  Location: [facility];  Service: Endoscopy;  Laterality: N/A;   VAGINAL HYSTERECTOMY      Social History   Tobacco Use   Smoking status: Never   Smokeless tobacco: Never  Vaping Use   Vaping Use: Never used  Substance Use Topics   Alcohol use: No    Alcohol/week: 0.0 standard drinks of alcohol   Drug use: No     Medication list has been reviewed and updated.  Current Meds  Medication Sig   amitriptyline (ELAVIL) 50 MG tablet Take 1 tablet (50 mg total) by mouth at bedtime.   Ascorbic Acid (VITAMIN C ADULT GUMMIES PO)    busPIRone (BUSPAR) 7.5 MG tablet Take 1 tablet (7.5 mg total) by mouth 2 (two) times daily.   Calcium Carbonate (CALCIUM 600 PO) Take 1 tablet by mouth daily.   Cholecalciferol (VITAMIN D3 ADULT GUMMIES PO) Take 1 tablet by mouth daily.    citalopram (CELEXA) 20 MG tablet Take 1 tablet (20 mg total) by mouth daily.   ezetimibe (ZETIA) 10 MG tablet Take 1 tablet (10 mg total) by mouth daily.   pantoprazole (PROTONIX) 40 MG tablet Take 1 tablet (40 mg total) by mouth daily.       03/12/2022    9:12 AM 09/05/2021    8:51 AM 07/13/2021   11:15 AM 03/01/2021    9:34 AM  GAD 7 : Generalized Anxiety Score  Nervous, Anxious, on Edge 0 0  0  Control/stop worrying 0 0 1 0  Worry too much - different things 0 0 1 0  Trouble relaxing 0 0 1 0  Restless 0 0 0 0  Easily annoyed or irritable 0 0 1 0  Afraid - awful might happen 0 0 0 0  Total GAD 7 Score 0 0  0  Anxiety Difficulty Not difficult at all Not difficult at all         03/12/2022    9:12 AM 09/05/2021    8:51 AM 07/13/2021   11:15 AM  Depression screen PHQ 2/9  Decreased Interest 0 0 0  Down, Depressed, Hopeless 0 0 0  PHQ - 2 Score 0 0 0  Altered sleeping 0 0 3   Tired, decreased energy 1 0 0  Change in appetite 0 0 0  Feeling bad or failure about yourself  0 0 0  Trouble concentrating 0 0 3  Moving slowly or fidgety/restless 0 0 1  Suicidal thoughts 0 0 0  PHQ-9 Score 1 0 7  Difficult doing work/chores Not difficult at all Not difficult at all Somewhat difficult    BP Readings from Last 3 Encounters:  03/12/22 138/88  09/05/21 136/80  07/13/21 130/80    Physical Exam Vitals and nursing note reviewed. Exam conducted with a chaperone present.  Constitutional:      General: She is not irritable.She is not in acute distress.    Appearance: She is not diaphoretic.  HENT:     Head: Normocephalic and atraumatic.     Right Ear: Tympanic membrane and external ear normal.     Left Ear: Tympanic membrane and external ear normal.  Nose: Nose normal.  Eyes:     General:        Right eye: No discharge.        Left eye: No discharge.     Conjunctiva/sclera: Conjunctivae normal.     Pupils: Pupils are equal, round, and reactive to light.  Neck:     Thyroid: No thyromegaly.     Vascular: No JVD.  Cardiovascular:     Rate and Rhythm: Normal rate and regular rhythm.     Heart sounds: Normal heart sounds. No murmur heard.    No friction rub. No gallop.  Pulmonary:     Effort: Pulmonary effort is normal.     Breath sounds: Normal breath sounds.  Abdominal:     General: Bowel sounds are normal.     Palpations: Abdomen is soft. There is no mass.     Tenderness: There is no abdominal tenderness. There is no guarding.  Musculoskeletal:        General: Normal range of motion.     Cervical back: Normal range of motion and neck supple.  Lymphadenopathy:     Cervical: No cervical adenopathy.  Skin:    General: Skin is warm and dry.  Neurological:     Mental Status: She is alert.     Wt Readings from Last 3 Encounters:  03/12/22 125 lb (56.7 kg)  09/05/21 123 lb (55.8 kg)  07/13/21 117 lb 12.8 oz (53.4 kg)    BP 138/88   Pulse 76    Ht 4' 11"  (1.499 m)   Wt 125 lb (56.7 kg)   BMI 25.25 kg/m   Assessment and Plan:

## 2022-03-13 LAB — LIPID PANEL WITH LDL/HDL RATIO
Cholesterol, Total: 180 mg/dL (ref 100–199)
HDL: 51 mg/dL (ref >39)
LDL Chol Calc (NIH): 100 mg/dL — ABNORMAL HIGH (ref 0–99)
LDL/HDL Ratio: 2 ratio (ref 0.0–3.2)
Triglycerides: 166 mg/dL — ABNORMAL HIGH (ref 0–149)
VLDL Cholesterol Cal: 29 mg/dL (ref 5–40)

## 2022-03-13 LAB — COMPREHENSIVE METABOLIC PANEL
ALT: 15 IU/L (ref 0–32)
AST: 20 IU/L (ref 0–40)
Albumin/Globulin Ratio: 1.7 (ref 1.2–2.2)
Albumin: 3.9 g/dL (ref 3.8–4.8)
Alkaline Phosphatase: 92 IU/L (ref 44–121)
BUN/Creatinine Ratio: 19 (ref 12–28)
BUN: 15 mg/dL (ref 8–27)
Bilirubin Total: <0.2 mg/dL (ref 0.0–1.2)
CO2: 21 mmol/L (ref 20–29)
Calcium: 9.1 mg/dL (ref 8.7–10.3)
Chloride: 102 mmol/L (ref 96–106)
Creatinine, Ser: 0.78 mg/dL (ref 0.57–1.00)
Globulin, Total: 2.3 g/dL (ref 1.5–4.5)
Glucose: 90 mg/dL (ref 70–99)
Potassium: 4.6 mmol/L (ref 3.5–5.2)
Sodium: 140 mmol/L (ref 134–144)
Total Protein: 6.2 g/dL (ref 6.0–8.5)
eGFR: 80 mL/min/{1.73_m2} (ref >59)

## 2022-03-18 ENCOUNTER — Other Ambulatory Visit: Payer: Self-pay

## 2022-03-18 DIAGNOSIS — F5101 Primary insomnia: Secondary | ICD-10-CM

## 2022-03-18 DIAGNOSIS — E782 Mixed hyperlipidemia: Secondary | ICD-10-CM

## 2022-03-18 DIAGNOSIS — F32A Depression, unspecified: Secondary | ICD-10-CM

## 2022-03-18 DIAGNOSIS — K2100 Gastro-esophageal reflux disease with esophagitis, without bleeding: Secondary | ICD-10-CM

## 2022-03-18 MED ORDER — EZETIMIBE 10 MG PO TABS: 10.0000 mg | ORAL_TABLET | Freq: Every day | ORAL | 0 refills | Status: DC

## 2022-03-18 MED ORDER — AMITRIPTYLINE HCL 50 MG PO TABS: 50.0000 mg | ORAL_TABLET | Freq: Every day | ORAL | 0 refills | Status: DC

## 2022-03-18 MED ORDER — BUSPIRONE HCL 7.5 MG PO TABS: 7.5000 mg | ORAL_TABLET | Freq: Two times a day (BID) | ORAL | 0 refills | Status: DC

## 2022-03-18 MED ORDER — PANTOPRAZOLE SODIUM 40 MG PO TBEC: 40.0000 mg | DELAYED_RELEASE_TABLET | Freq: Every day | ORAL | 0 refills | Status: DC

## 2022-03-18 MED ORDER — CITALOPRAM HYDROBROMIDE 20 MG PO TABS: 20.0000 mg | ORAL_TABLET | Freq: Every day | ORAL | 0 refills | Status: DC

## 2022-03-29 ENCOUNTER — Ambulatory Visit (INDEPENDENT_AMBULATORY_CARE_PROVIDER_SITE_OTHER): Payer: Medicare Other | Admitting: Family Medicine

## 2022-03-29 ENCOUNTER — Encounter: Payer: Self-pay | Admitting: Family Medicine

## 2022-03-29 VITALS — BP 120/80 | HR 78 | Ht 59.0 in | Wt 124.0 lb

## 2022-03-29 DIAGNOSIS — R058 Other specified cough: Secondary | ICD-10-CM

## 2022-03-29 MED ORDER — AZITHROMYCIN 250 MG PO TABS: ORAL_TABLET | ORAL | 0 refills | Status: AC

## 2022-03-29 MED ORDER — GUAIFENESIN-DM 100-10 MG/5ML PO SYRP: 5.0000 mL | ORAL_SOLUTION | ORAL | 0 refills | Status: DC | PRN

## 2022-03-29 NOTE — Progress Notes (Signed)
  Date:  03/29/2022   Name:  Michelle Willis   DOB:  07/24/1951   MRN:  [id number]   Chief Complaint: Cough (Nonproductive, no fever or chills- cough gets worse at night.)  Cough This is a new problem. The current episode started 1 to 4 weeks ago. The problem has been waxing and waning. The problem occurs every few minutes. The cough is Non-productive. Pertinent negatives include no chest pain, chills, ear congestion, ear pain, fever, myalgias, nasal congestion, postnasal drip, shortness of breath or wheezing.    Lab Results  Component Value Date   NA 140 03/12/2022   K 4.6 03/12/2022   CO2 21 03/12/2022   GLUCOSE 90 03/12/2022   BUN 15 03/12/2022   CREATININE 0.78 03/12/2022   CALCIUM 9.1 03/12/2022   EGFR 80 03/12/2022   GFRNONAA 73 08/31/2020   Lab Results  Component Value Date   CHOL 180 03/12/2022   HDL 51 03/12/2022   LDLCALC 100 (H) 03/12/2022   TRIG 166 (H) 03/12/2022   No results found for: "TSH" No results found for: "HGBA1C" No results found for: "WBC", "HGB", "HCT", "MCV", "PLT" Lab Results  Component Value Date   ALT 15 03/12/2022   AST 20 03/12/2022   ALKPHOS 92 03/12/2022   BILITOT <0.2 03/12/2022   No results found for: "25OHVITD2", "25OHVITD3", "VD25OH"   Review of Systems  Constitutional:  Negative for chills and fever.  HENT:  Negative for ear pain and postnasal drip.   Respiratory:  Positive for cough. Negative for chest tightness, shortness of breath, wheezing and stridor.   Cardiovascular:  Negative for chest pain, palpitations and leg swelling.  Musculoskeletal:  Negative for myalgias.    Patient Active Problem List   Diagnosis Date Noted   Gastroesophageal reflux disease with esophagitis without hemorrhage 09/06/2019   Problems with swallowing and mastication    Stricture and stenosis of esophagus    Encounter for screening colonoscopy    Primary insomnia 03/13/2017   Essential hypertension 09/30/2016   Anxiety and depression 09/30/2016    Major depressive disorder, recurrent episode, moderate ([facility]) 03/28/2015    No Known Allergies  Past Surgical History:  Procedure Laterality Date   ABDOMINAL HYSTERECTOMY     CHOLECYSTECTOMY     COLONOSCOPY WITH PROPOFOL N/A 06/24/2019   Procedure: COLONOSCOPY WITH PROPOFOL;  Surgeon: Wohl, Darren, MD;  Location: [facility];  Service: Endoscopy;  Laterality: N/A;   ESOPHAGOGASTRODUODENOSCOPY (EGD) WITH PROPOFOL N/A 07/16/2019   Procedure: ESOPHAGOGASTRODUODENOSCOPY (EGD) WITH DILATION;  Surgeon: Wohl, Darren, MD;  Location: [facility];  Service: Endoscopy;  Laterality: N/A;   VAGINAL HYSTERECTOMY      Social History   Tobacco Use   Smoking status: Never   Smokeless tobacco: Never  Vaping Use   Vaping Use: Never used  Substance Use Topics   Alcohol use: No    Alcohol/week: 0.0 standard drinks of alcohol   Drug use: No     Medication list has been reviewed and updated.  No outpatient medications have been marked as taking for the 03/29/22 encounter (Office Visit) with Jones, Deanna C, MD.       03/29/2022   10:55 AM 03/12/2022    9:12 AM 09/05/2021    8:51 AM 07/13/2021   11:15 AM  GAD 7 : Generalized Anxiety Score  Nervous, Anxious, on Edge 0 0 0   Control/stop worrying 0 0 0 1  Worry too much - different things 0 0 0 1  Trouble relaxing   0 0 0 1  Restless 0 0 0 0  Easily annoyed or irritable 0 0 0 1  Afraid - awful might happen 0 0 0 0  Total GAD 7 Score 0 0 0   Anxiety Difficulty Not difficult at all Not difficult at all Not difficult at all        03/29/2022   10:55 AM 03/12/2022    9:12 AM 09/05/2021    8:51 AM  Depression screen PHQ 2/9  Decreased Interest 0 0 0  Down, Depressed, Hopeless 0 0 0  PHQ - 2 Score 0 0 0  Altered sleeping 0 0 0  Tired, decreased energy 0 1 0  Change in appetite 0 0 0  Feeling bad or failure about yourself  0 0 0  Trouble concentrating 0 0 0  Moving slowly or fidgety/restless 0 0 0  Suicidal thoughts 0 0 0   PHQ-9 Score 0 1 0  Difficult doing work/chores Not difficult at all Not difficult at all Not difficult at all    BP Readings from Last 3 Encounters:  03/29/22 120/80  03/12/22 138/88  09/05/21 136/80    Physical Exam Vitals and nursing note reviewed.  HENT:     Head: Normocephalic.     Right Ear: Tympanic membrane, ear canal and external ear normal.     Left Ear: Tympanic membrane, ear canal and external ear normal.     Nose: Nose normal.     Mouth/Throat:     Mouth: Mucous membranes are moist.  Cardiovascular:     Heart sounds: No murmur heard.    No gallop.  Pulmonary:     Breath sounds: No wheezing or rhonchi.  Chest:     Chest wall: No tenderness.  Neurological:     Mental Status: She is alert.     Wt Readings from Last 3 Encounters:  03/29/22 124 lb (56.2 kg)  03/12/22 125 lb (56.7 kg)  09/05/21 123 lb (55.8 kg)    BP 120/80   Pulse 78   Ht 4' 11" (1.499 m)   Wt 124 lb (56.2 kg)   SpO2 98%   BMI 25.04 kg/m   Assessment and Plan:  1. Cough productive of purulent sputum New onset persistent.  Purulent sputum.  Patient has had a cough every few minutes for 2-1/2 weeks.  This is beginning to affect sleep.  Given the duration of the cough and the purulent nature we will treat with azithromycin 2 tablets today followed by 1 a day for 4 days and Robitussin with dextromethorphan as been prescribed as needed cough. - azithromycin (ZITHROMAX) 250 MG tablet; Take 2 tablets on day 1, then 1 tablet daily on days 2 through 5  Dispense: 6 tablet; Refill: 0 - guaiFENesin-dextromethorphan (ROBITUSSIN DM) 100-10 MG/5ML syrup; Take 5 mLs by mouth every 4 (four) hours as needed for cough.  Dispense: 118 mL; Refill: 0

## 2022-05-01 ENCOUNTER — Ambulatory Visit (INDEPENDENT_AMBULATORY_CARE_PROVIDER_SITE_OTHER): Payer: Medicare Other | Admitting: Internal Medicine

## 2022-05-01 ENCOUNTER — Encounter: Payer: Self-pay | Admitting: Internal Medicine

## 2022-05-01 VITALS — BP 116/78 | HR 95 | Ht 59.0 in | Wt 123.0 lb

## 2022-05-01 DIAGNOSIS — Z23 Encounter for immunization: Secondary | ICD-10-CM | POA: Diagnosis not present

## 2022-05-01 DIAGNOSIS — R399 Unspecified symptoms and signs involving the genitourinary system: Secondary | ICD-10-CM | POA: Diagnosis not present

## 2022-05-01 LAB — POCT URINALYSIS DIPSTICK
Bilirubin, UA: NEGATIVE
Blood, UA: NEGATIVE
Glucose, UA: NEGATIVE
Ketones, UA: NEGATIVE
Nitrite, UA: NEGATIVE
Protein, UA: NEGATIVE
Spec Grav, UA: 1.025 (ref 1.010–1.025)
Urobilinogen, UA: 0.2 E.U./dL
pH, UA: 6 (ref 5.0–8.0)

## 2022-05-01 MED ORDER — SULFAMETHOXAZOLE-TRIMETHOPRIM 800-160 MG PO TABS: 1.0000 | ORAL_TABLET | Freq: Two times a day (BID) | ORAL | 0 refills | Status: AC

## 2022-05-01 NOTE — Progress Notes (Signed)
Date:  05/01/2022   Name:  Michelle Willis   DOB:  [birth year]   MRN:  [id number]   Chief Complaint: Urinary Tract Infection  Urinary Tract Infection  This is a recurrent problem. Episode onset: X 2 days. The problem has been unchanged. Quality: itching. The pain is at a severity of 8/10. The pain is mild. There has been no fever. There is No history of pyelonephritis. Associated symptoms include frequency and urgency. Pertinent negatives include no chills, hematuria, nausea or vomiting. She has tried nothing for the symptoms. Her past medical history is significant for recurrent UTIs.    Lab Results  Component Value Date   NA 140 03/12/2022   K 4.6 03/12/2022   CO2 21 03/12/2022   GLUCOSE 90 03/12/2022   BUN 15 03/12/2022   CREATININE 0.78 03/12/2022   CALCIUM 9.1 03/12/2022   EGFR 80 03/12/2022   GFRNONAA 73 08/31/2020   Lab Results  Component Value Date   CHOL 180 03/12/2022   HDL 51 03/12/2022   LDLCALC 100 (H) 03/12/2022   TRIG 166 (H) 03/12/2022   No results found for: "TSH" No results found for: "HGBA1C" No results found for: "WBC", "HGB", "HCT", "MCV", "PLT" Lab Results  Component Value Date   ALT 15 03/12/2022   AST 20 03/12/2022   ALKPHOS 92 03/12/2022   BILITOT <0.2 03/12/2022   No results found for: "25OHVITD2", "25OHVITD3", "VD25OH"   Review of Systems  Constitutional:  Negative for chills, fatigue and fever.  Respiratory:  Negative for chest tightness and shortness of breath.   Cardiovascular:  Negative for chest pain.  Gastrointestinal:  Negative for diarrhea, nausea and vomiting.  Genitourinary:  Positive for frequency and urgency. Negative for hematuria.  Psychiatric/Behavioral:  Negative for dysphoric mood and sleep disturbance. The patient is not nervous/anxious.     Patient Active Problem List   Diagnosis Date Noted   Gastroesophageal reflux disease with esophagitis without hemorrhage 09/06/2019   Problems with swallowing and mastication     Stricture and stenosis of esophagus    Encounter for screening colonoscopy    Primary insomnia 03/13/2017   Essential hypertension 09/30/2016   Anxiety and depression 09/30/2016   Major depressive disorder, recurrent episode, moderate ([place]) 03/28/2015    No Known Allergies  Past Surgical History:  Procedure Laterality Date   ABDOMINAL HYSTERECTOMY     CHOLECYSTECTOMY     COLONOSCOPY WITH PROPOFOL N/A 06/24/2019   Procedure: COLONOSCOPY WITH PROPOFOL;  Surgeon: Lucilla Lame, MD;  Location: [facility];  Service: Endoscopy;  Laterality: N/A;   ESOPHAGOGASTRODUODENOSCOPY (EGD) WITH PROPOFOL N/A 07/16/2019   Procedure: ESOPHAGOGASTRODUODENOSCOPY (EGD) WITH DILATION;  Surgeon: Lucilla Lame, MD;  Location: [place];  Service: Endoscopy;  Laterality: N/A;   VAGINAL HYSTERECTOMY      Social History   Tobacco Use   Smoking status: Never   Smokeless tobacco: Never  Vaping Use   Vaping Use: Never used  Substance Use Topics   Alcohol use: No    Alcohol/week: 0.0 standard drinks of alcohol   Drug use: No     Medication list has been reviewed and updated.  Current Meds  Medication Sig   amitriptyline (ELAVIL) 50 MG tablet Take 1 tablet (50 mg total) by mouth at bedtime.   Ascorbic Acid (VITAMIN C ADULT GUMMIES PO)    busPIRone (BUSPAR) 7.5 MG tablet Take 1 tablet (7.5 mg total) by mouth 2 (two) times daily.   Calcium Carbonate (CALCIUM 600 PO) Take 1  tablet by mouth daily.   Cholecalciferol (VITAMIN D3 ADULT GUMMIES PO) Take 1 tablet by mouth daily.    citalopram (CELEXA) 20 MG tablet Take 1 tablet (20 mg total) by mouth daily.   ezetimibe (ZETIA) 10 MG tablet Take 1 tablet (10 mg total) by mouth daily.   guaiFENesin-dextromethorphan (ROBITUSSIN DM) 100-10 MG/5ML syrup Take 5 mLs by mouth every 4 (four) hours as needed for cough.   pantoprazole (PROTONIX) 40 MG tablet Take 1 tablet (40 mg total) by mouth daily.       05/01/2022    1:23 PM 03/29/2022   10:55 AM  03/12/2022    9:12 AM 09/05/2021    8:51 AM  GAD 7 : Generalized Anxiety Score  Nervous, Anxious, on Edge 0 0 0 0  Control/stop worrying 0 0 0 0  Worry too much - different things 0 0 0 0  Trouble relaxing 0 0 0 0  Restless 0 0 0 0  Easily annoyed or irritable 0 0 0 0  Afraid - awful might happen 0 0 0 0  Total GAD 7 Score 0 0 0 0  Anxiety Difficulty Not difficult at all Not difficult at all Not difficult at all Not difficult at all       05/01/2022    1:23 PM 03/29/2022   10:55 AM 03/12/2022    9:12 AM  Depression screen PHQ 2/9  Decreased Interest 0 0 0  Down, Depressed, Hopeless 0 0 0  PHQ - 2 Score 0 0 0  Altered sleeping 0 0 0  Tired, decreased energy 0 0 1  Change in appetite 0 0 0  Feeling bad or failure about yourself  0 0 0  Trouble concentrating 0 0 0  Moving slowly or fidgety/restless 0 0 0  Suicidal thoughts 0 0 0  PHQ-9 Score 0 0 1  Difficult doing work/chores Not difficult at all Not difficult at all Not difficult at all    BP Readings from Last 3 Encounters:  05/01/22 116/78  03/29/22 120/80  03/12/22 138/88    Physical Exam Vitals and nursing note reviewed.  Constitutional:      Appearance: Normal appearance. She is well-developed.  Cardiovascular:     Rate and Rhythm: Normal rate and regular rhythm.     Heart sounds: Normal heart sounds.  Pulmonary:     Effort: Pulmonary effort is normal. No respiratory distress.     Breath sounds: Normal breath sounds.  Abdominal:     General: Bowel sounds are normal.     Palpations: Abdomen is soft.     Tenderness: There is abdominal tenderness in the suprapubic area. There is no right CVA tenderness, left CVA tenderness, guarding or rebound.  Neurological:     General: No focal deficit present.     Wt Readings from Last 3 Encounters:  05/01/22 123 lb (55.8 kg)  03/29/22 124 lb (56.2 kg)  03/12/22 125 lb (56.7 kg)    BP 116/78   Pulse 95   Ht 4' 11"  (1.499 m)   Wt 123 lb (55.8 kg)   SpO2 95%   BMI  24.84 kg/m   Assessment and Plan: 1. UTI symptoms Positive dipstick in office with classic symptoms  Treat with bactrim, continue to push fluids - POCT urinalysis dipstick - sulfamethoxazole-trimethoprim (BACTRIM DS) 800-160 MG tablet; Take 1 tablet by mouth 2 (two) times daily for 7 days.  Dispense: 14 tablet; Refill: 0   Partially dictated using Editor, commissioning. Any errors are unintentional.  Halina Maidens, MD [facility]  05/01/2022

## 2022-05-20 ENCOUNTER — Ambulatory Visit (INDEPENDENT_AMBULATORY_CARE_PROVIDER_SITE_OTHER): Payer: Medicare Other | Admitting: Family Medicine

## 2022-05-20 VITALS — BP 120/80 | HR 68 | Ht 59.0 in | Wt 123.0 lb

## 2022-05-20 DIAGNOSIS — N3090 Cystitis, unspecified without hematuria: Secondary | ICD-10-CM

## 2022-05-20 DIAGNOSIS — R399 Unspecified symptoms and signs involving the genitourinary system: Secondary | ICD-10-CM

## 2022-05-20 LAB — POCT URINALYSIS DIPSTICK
Bilirubin, UA: NEGATIVE
Glucose, UA: NEGATIVE
Ketones, UA: NEGATIVE
Nitrite, UA: POSITIVE
Protein, UA: NEGATIVE
Spec Grav, UA: 1.02 (ref 1.010–1.025)
Urobilinogen, UA: 0.2 E.U./dL
pH, UA: 7 (ref 5.0–8.0)

## 2022-05-20 MED ORDER — CEPHALEXIN 500 MG PO CAPS: 500.0000 mg | ORAL_CAPSULE | Freq: Three times a day (TID) | ORAL | 1 refills | Status: DC

## 2022-05-20 NOTE — Progress Notes (Signed)
Date:  05/20/2022   Name:  Michelle Willis   DOB:  Jul 28, 1951   MRN:  [id number]   Chief Complaint: Urinary Tract Infection  Urinary Tract Infection  This is a recurrent problem. The current episode started in the past 7 days (friday). The problem has been unchanged. The quality of the pain is described as burning. The pain is mild. She is Sexually active. Associated symptoms include frequency and urgency. Pertinent negatives include no chills, discharge, flank pain, hematuria, hesitancy, nausea, sweats or vomiting. She has tried increased fluids for the symptoms. The treatment provided moderate relief. Her past medical history is significant for recurrent UTIs.    Lab Results  Component Value Date   NA 140 03/12/2022   K 4.6 03/12/2022   CO2 21 03/12/2022   GLUCOSE 90 03/12/2022   BUN 15 03/12/2022   CREATININE 0.78 03/12/2022   CALCIUM 9.1 03/12/2022   EGFR 80 03/12/2022   GFRNONAA 73 08/31/2020   Lab Results  Component Value Date   CHOL 180 03/12/2022   HDL 51 03/12/2022   LDLCALC 100 (H) 03/12/2022   TRIG 166 (H) 03/12/2022   No results found for: "TSH" No results found for: "HGBA1C" No results found for: "WBC", "HGB", "HCT", "MCV", "PLT" Lab Results  Component Value Date   ALT 15 03/12/2022   AST 20 03/12/2022   ALKPHOS 92 03/12/2022   BILITOT <0.2 03/12/2022   No results found for: "25OHVITD2", "25OHVITD3", "VD25OH"   Review of Systems  Constitutional: Negative.  Negative for chills, fatigue, fever and unexpected weight change.  HENT:  Negative for congestion, ear discharge, ear pain, rhinorrhea, sinus pressure, sneezing and sore throat.   Respiratory:  Negative for cough, shortness of breath, wheezing and stridor.   Gastrointestinal:  Negative for abdominal pain, blood in stool, constipation, diarrhea, nausea and vomiting.  Genitourinary:  Positive for frequency and urgency. Negative for dysuria, flank pain, hematuria, hesitancy and vaginal discharge.   Musculoskeletal:  Negative for arthralgias, back pain and myalgias.  Skin:  Negative for rash.  Neurological:  Negative for dizziness, weakness and headaches.  Hematological:  Negative for adenopathy. Does not bruise/bleed easily.  Psychiatric/Behavioral:  Negative for dysphoric mood. The patient is not nervous/anxious.     Patient Active Problem List   Diagnosis Date Noted   Gastroesophageal reflux disease with esophagitis without hemorrhage 09/06/2019   Problems with swallowing and mastication    Stricture and stenosis of esophagus    Encounter for screening colonoscopy    Primary insomnia 03/13/2017   Essential hypertension 09/30/2016   Anxiety and depression 09/30/2016   Major depressive disorder, recurrent episode, moderate ([place]) 03/28/2015    No Known Allergies  Past Surgical History:  Procedure Laterality Date   ABDOMINAL HYSTERECTOMY     CHOLECYSTECTOMY     COLONOSCOPY WITH PROPOFOL N/A 06/24/2019   Procedure: COLONOSCOPY WITH PROPOFOL;  Surgeon: Lucilla Lame, MD;  Location: [place] [facility];  Service: Endoscopy;  Laterality: N/A;   ESOPHAGOGASTRODUODENOSCOPY (EGD) WITH PROPOFOL N/A 07/16/2019   Procedure: ESOPHAGOGASTRODUODENOSCOPY (EGD) WITH DILATION;  Surgeon: Lucilla Lame, MD;  Location: [place];  Service: Endoscopy;  Laterality: N/A;   VAGINAL HYSTERECTOMY      Social History   Tobacco Use   Smoking status: Never   Smokeless tobacco: Never  Vaping Use   Vaping Use: Never used  Substance Use Topics   Alcohol use: No    Alcohol/week: 0.0 standard drinks of alcohol   Drug use: No  Medication list has been reviewed and updated.  Current Meds  Medication Sig   amitriptyline (ELAVIL) 50 MG tablet Take 1 tablet (50 mg total) by mouth at bedtime.   Ascorbic Acid (VITAMIN C ADULT GUMMIES PO)    busPIRone (BUSPAR) 7.5 MG tablet Take 1 tablet (7.5 mg total) by mouth 2 (two) times daily.   Calcium Carbonate (CALCIUM 600 PO) Take 1 tablet by  mouth daily.   Cholecalciferol (VITAMIN D3 ADULT GUMMIES PO) Take 1 tablet by mouth daily.    citalopram (CELEXA) 20 MG tablet Take 1 tablet (20 mg total) by mouth daily.   ezetimibe (ZETIA) 10 MG tablet Take 1 tablet (10 mg total) by mouth daily.   guaiFENesin-dextromethorphan (ROBITUSSIN DM) 100-10 MG/5ML syrup Take 5 mLs by mouth every 4 (four) hours as needed for cough.   pantoprazole (PROTONIX) 40 MG tablet Take 1 tablet (40 mg total) by mouth daily.       05/20/2022    3:44 PM 05/01/2022    1:23 PM 03/29/2022   10:55 AM 03/12/2022    9:12 AM  GAD 7 : Generalized Anxiety Score  Nervous, Anxious, on Edge 0 0 0 0  Control/stop worrying 0 0 0 0  Worry too much - different things 0 0 0 0  Trouble relaxing 0 0 0 0  Restless 0 0 0 0  Easily annoyed or irritable 0 0 0 0  Afraid - awful might happen 0 0 0 0  Total GAD 7 Score 0 0 0 0  Anxiety Difficulty Not difficult at all Not difficult at all Not difficult at all Not difficult at all       05/20/2022    3:44 PM 05/01/2022    1:23 PM 03/29/2022   10:55 AM  Depression screen PHQ 2/9  Decreased Interest 0 0 0  Down, Depressed, Hopeless 0 0 0  PHQ - 2 Score 0 0 0  Altered sleeping 0 0 0  Tired, decreased energy 0 0 0  Change in appetite 0 0 0  Feeling bad or failure about yourself  0 0 0  Trouble concentrating 0 0 0  Moving slowly or fidgety/restless 0 0 0  Suicidal thoughts 0 0 0  PHQ-9 Score 0 0 0  Difficult doing work/chores Not difficult at all Not difficult at all Not difficult at all    BP Readings from Last 3 Encounters:  05/20/22 120/80  05/01/22 116/78  03/29/22 120/80    Physical Exam Vitals and nursing note reviewed.  Constitutional:      Appearance: She is well-developed.  HENT:     Head: Normocephalic.     Right Ear: Tympanic membrane and external ear normal.     Left Ear: Tympanic membrane and external ear normal.     Mouth/Throat:     Mouth: Mucous membranes are moist.  Eyes:     General: Lids are  everted, no foreign bodies appreciated. No scleral icterus.       Left eye: No foreign body or hordeolum.     Conjunctiva/sclera: Conjunctivae normal.     Right eye: Right conjunctiva is not injected.     Left eye: Left conjunctiva is not injected.     Pupils: Pupils are equal, round, and reactive to light.  Neck:     Thyroid: No thyromegaly.     Vascular: No carotid bruit or JVD.     Trachea: No tracheal deviation.  Cardiovascular:     Rate and Rhythm: Normal rate and regular  rhythm.     Heart sounds: Normal heart sounds. No murmur heard.    No friction rub. No gallop.  Pulmonary:     Effort: Pulmonary effort is normal. No respiratory distress.     Breath sounds: Normal breath sounds. No wheezing, rhonchi or rales.  Abdominal:     General: Bowel sounds are normal.     Palpations: Abdomen is soft. There is no mass.     Tenderness: There is no abdominal tenderness. There is no guarding or rebound.  Musculoskeletal:        General: No tenderness. Normal range of motion.     Cervical back: Normal range of motion and neck supple.  Lymphadenopathy:     Cervical: No cervical adenopathy.  Skin:    General: Skin is warm.     Coloration: Skin is not pale.     Findings: No rash.  Neurological:     Mental Status: She is alert and oriented to person, place, and time.     Cranial Nerves: No cranial nerve deficit.     Deep Tendon Reflexes: Reflexes normal.  Psychiatric:        Mood and Affect: Mood is not anxious or depressed.    Wt Readings from Last 3 Encounters:  05/20/22 123 lb (55.8 kg)  05/01/22 123 lb (55.8 kg)  03/29/22 124 lb (56.2 kg)    BP 120/80   Pulse 68   Ht _0  (1.499 m)   Wt 123 lb (55.8 kg)   BMI 24.84 kg/m   Assessment and Plan:  1. UTI symptoms Recurrent symptoms of dysuria frequency and urgency.  Onset was Friday night patient presents to clinic and urinalysis was done.  Leukocytes are noted on urinalysis.  Urinalysis has been submitted given the  recurrence. - POCT urinalysis dipstick - Urine Culture  2. Cystitis Urinalysis is consistent with cystitis.  We will treat with cephalexin 3 times a day for 3 days and patient has been given extra antibiotics since she has recently found a relationship and is to start taking 1 cephalexin either later in the evening or the morning thereof for suppression.   Otilio Miu, MD

## 2022-05-23 LAB — URINE CULTURE

## 2022-05-24 ENCOUNTER — Other Ambulatory Visit: Payer: Self-pay

## 2022-05-24 DIAGNOSIS — R399 Unspecified symptoms and signs involving the genitourinary system: Secondary | ICD-10-CM

## 2022-05-24 DIAGNOSIS — N3090 Cystitis, unspecified without hematuria: Secondary | ICD-10-CM

## 2022-05-24 MED ORDER — NITROFURANTOIN MONOHYD MACRO 100 MG PO CAPS: 100.0000 mg | ORAL_CAPSULE | Freq: Two times a day (BID) | ORAL | 0 refills | Status: AC

## 2022-05-24 NOTE — Progress Notes (Signed)
Sent in antibiotic 

## 2022-08-09 ENCOUNTER — Telehealth: Payer: Self-pay | Admitting: Family Medicine

## 2022-08-09 NOTE — Telephone Encounter (Signed)
Copied from [place] [id number]. Topic: Medicare AWV >> Aug 09, 2022  2:01 PM Devoria Glassing wrote: Reason for CRM: Left message tor patient to schedule Medicare Annual Wellness Visit (AWV) with [facility] Health Advisor.  Appointment can be an offiice/telephone or virtual visit;  Please call [phone] ask for [facility].

## 2022-09-12 ENCOUNTER — Encounter: Payer: Self-pay | Admitting: Family Medicine

## 2022-09-12 ENCOUNTER — Ambulatory Visit (INDEPENDENT_AMBULATORY_CARE_PROVIDER_SITE_OTHER): Payer: Medicare Other | Admitting: Family Medicine

## 2022-09-12 VITALS — BP 128/78 | HR 67 | Ht 59.0 in | Wt 127.0 lb

## 2022-09-12 DIAGNOSIS — F32A Depression, unspecified: Secondary | ICD-10-CM

## 2022-09-12 DIAGNOSIS — F419 Anxiety disorder, unspecified: Secondary | ICD-10-CM | POA: Diagnosis not present

## 2022-09-12 DIAGNOSIS — F5101 Primary insomnia: Secondary | ICD-10-CM

## 2022-09-12 DIAGNOSIS — K2100 Gastro-esophageal reflux disease with esophagitis, without bleeding: Secondary | ICD-10-CM | POA: Diagnosis not present

## 2022-09-12 DIAGNOSIS — I10 Essential (primary) hypertension: Secondary | ICD-10-CM

## 2022-09-12 DIAGNOSIS — E782 Mixed hyperlipidemia: Secondary | ICD-10-CM | POA: Diagnosis not present

## 2022-09-12 MED ORDER — CITALOPRAM HYDROBROMIDE 20 MG PO TABS: 20.0000 mg | ORAL_TABLET | Freq: Every day | ORAL | 1 refills | Status: DC

## 2022-09-12 MED ORDER — BUSPIRONE HCL 7.5 MG PO TABS: 7.5000 mg | ORAL_TABLET | Freq: Two times a day (BID) | ORAL | 1 refills | Status: DC

## 2022-09-12 MED ORDER — PANTOPRAZOLE SODIUM 40 MG PO TBEC: 40.0000 mg | DELAYED_RELEASE_TABLET | Freq: Every day | ORAL | 1 refills | Status: DC

## 2022-09-12 MED ORDER — AMITRIPTYLINE HCL 50 MG PO TABS: 50.0000 mg | ORAL_TABLET | Freq: Every day | ORAL | 1 refills | Status: DC

## 2022-09-12 MED ORDER — EZETIMIBE 10 MG PO TABS: 10.0000 mg | ORAL_TABLET | Freq: Every day | ORAL | 1 refills | Status: DC

## 2022-09-12 NOTE — Progress Notes (Signed)
Date:  09/12/2022   Name:  Michelle Willis   DOB:  [birth year]   MRN:  [id number]   Chief Complaint: Insomnia, Anxiety, Hyperlipidemia, and Gastroesophageal Reflux  Insomnia Primary symptoms: no difficulty falling asleep.   The problem has been gradually improving since onset. The treatment provided mild relief. PMH includes: depression.   Anxiety Presents for follow-up visit. Symptoms include insomnia. Patient reports no chest pain, compulsions, decreased concentration, depressed mood, dizziness, dry mouth, excessive worry, irritability, nausea, nervous/anxious behavior, palpitations, panic or shortness of breath.    Hyperlipidemia This is a chronic problem. The current episode started more than 1 year ago. The problem is controlled. Recent lipid tests were reviewed and are normal. She has no history of chronic renal disease or diabetes. Pertinent negatives include no chest pain, focal sensory loss, myalgias or shortness of breath. The current treatment provides moderate improvement of lipids.  Gastroesophageal Reflux She reports no abdominal pain, no chest pain, no coughing, no dysphagia, no early satiety, no nausea, no sore throat or no stridor. This is a chronic problem. Nothing aggravates the symptoms. Pertinent negatives include no fatigue. She has tried a PPI for the symptoms. The treatment provided moderate relief. Past procedures do not include an abdominal ultrasound, esophageal pH monitoring or a UGI.  Hypertension This is a chronic problem. The current episode started more than 1 year ago. The problem has been gradually improving since onset. The problem is controlled. Associated symptoms include anxiety. Pertinent negatives include no chest pain, headaches, neck pain, palpitations, peripheral edema or shortness of breath. Past treatments include ACE inhibitors. The current treatment provides moderate improvement. There are no compliance problems.  There is no history of chronic renal  disease.    Lab Results  Component Value Date   NA 140 03/12/2022   K 4.6 03/12/2022   CO2 21 03/12/2022   GLUCOSE 90 03/12/2022   BUN 15 03/12/2022   CREATININE 0.78 03/12/2022   CALCIUM 9.1 03/12/2022   EGFR 80 03/12/2022   GFRNONAA 73 08/31/2020   Lab Results  Component Value Date   CHOL 180 03/12/2022   HDL 51 03/12/2022   LDLCALC 100 (H) 03/12/2022   TRIG 166 (H) 03/12/2022   No results found for: "TSH" No results found for: "HGBA1C" No results found for: "WBC", "HGB", "HCT", "MCV", "PLT" Lab Results  Component Value Date   ALT 15 03/12/2022   AST 20 03/12/2022   ALKPHOS 92 03/12/2022   BILITOT <0.2 03/12/2022   No results found for: "25OHVITD2", "25OHVITD3", "VD25OH"   Review of Systems  Constitutional:  Negative for fatigue, irritability and unexpected weight change.  HENT:  Negative for congestion and sore throat.   Eyes:  Negative for visual disturbance.  Respiratory:  Negative for cough, chest tightness and shortness of breath.   Cardiovascular:  Negative for chest pain and palpitations.  Gastrointestinal:  Negative for abdominal pain, blood in stool, dysphagia and nausea.  Endocrine: Negative for polydipsia and polyuria.  Musculoskeletal:  Negative for myalgias and neck pain.  Neurological:  Negative for dizziness and headaches.  Psychiatric/Behavioral:  Positive for depression. Negative for decreased concentration. The patient has insomnia. The patient is not nervous/anxious.     Patient Active Problem List   Diagnosis Date Noted   Gastroesophageal reflux disease with esophagitis without hemorrhage 09/06/2019   Problems with swallowing and mastication    Stricture and stenosis of esophagus    Encounter for screening colonoscopy    Primary insomnia 03/13/2017  Essential hypertension 09/30/2016   Anxiety and depression 09/30/2016   Major depressive disorder, recurrent episode, moderate ([place]) 03/28/2015    No Known Allergies  Past Surgical  History:  Procedure Laterality Date   ABDOMINAL HYSTERECTOMY     CHOLECYSTECTOMY     COLONOSCOPY WITH PROPOFOL N/A 06/24/2019   Procedure: COLONOSCOPY WITH PROPOFOL;  Surgeon: Lucilla Lame, MD;  Location: [place];  Service: Endoscopy;  Laterality: N/A;   ESOPHAGOGASTRODUODENOSCOPY (EGD) WITH PROPOFOL N/A 07/16/2019   Procedure: ESOPHAGOGASTRODUODENOSCOPY (EGD) WITH DILATION;  Surgeon: Lucilla Lame, MD;  Location: [place];  Service: Endoscopy;  Laterality: N/A;   VAGINAL HYSTERECTOMY      Social History   Tobacco Use   Smoking status: Never   Smokeless tobacco: Never  Vaping Use   Vaping Use: Never used  Substance Use Topics   Alcohol use: No    Alcohol/week: 0.0 standard drinks of alcohol   Drug use: No     Medication list has been reviewed and updated.  Current Meds  Medication Sig   amitriptyline (ELAVIL) 50 MG tablet Take 1 tablet (50 mg total) by mouth at bedtime.   Ascorbic Acid (VITAMIN C ADULT GUMMIES PO)    busPIRone (BUSPAR) 7.5 MG tablet Take 1 tablet (7.5 mg total) by mouth 2 (two) times daily.   Calcium Carbonate (CALCIUM 600 PO) Take 1 tablet by mouth daily.   Cholecalciferol (VITAMIN D3 ADULT GUMMIES PO) Take 1 tablet by mouth daily.    citalopram (CELEXA) 20 MG tablet Take 1 tablet (20 mg total) by mouth daily.   ezetimibe (ZETIA) 10 MG tablet Take 1 tablet (10 mg total) by mouth daily.   pantoprazole (PROTONIX) 40 MG tablet Take 1 tablet (40 mg total) by mouth daily.       09/12/2022    9:17 AM 05/20/2022    3:44 PM 05/01/2022    1:23 PM 03/29/2022   10:55 AM  GAD 7 : Generalized Anxiety Score  Nervous, Anxious, on Edge 0 0 0 0  Control/stop worrying 0 0 0 0  Worry too much - different things 0 0 0 0  Trouble relaxing 0 0 0 0  Restless 0 0 0 0  Easily annoyed or irritable 0 0 0 0  Afraid - awful might happen 0 0 0 0  Total GAD 7 Score 0 0 0 0  Anxiety Difficulty Not difficult at all Not difficult at all Not difficult at all Not  difficult at all       09/12/2022    9:16 AM 05/20/2022    3:44 PM 05/01/2022    1:23 PM  Depression screen PHQ 2/9  Decreased Interest 0 0 0  Down, Depressed, Hopeless 0 0 0  PHQ - 2 Score 0 0 0  Altered sleeping 0 0 0  Tired, decreased energy 0 0 0  Change in appetite 0 0 0  Feeling bad or failure about yourself  0 0 0  Trouble concentrating 0 0 0  Moving slowly or fidgety/restless 0 0 0  Suicidal thoughts 0 0 0  PHQ-9 Score 0 0 0  Difficult doing work/chores Not difficult at all Not difficult at all Not difficult at all    BP Readings from Last 3 Encounters:  09/12/22 128/78  05/20/22 120/80  05/01/22 116/78    Physical Exam Vitals and nursing note reviewed. Exam conducted with a chaperone present.  Constitutional:      General: She is not in acute distress.    Appearance:  She is not diaphoretic.  HENT:     Head: Normocephalic and atraumatic.     Right Ear: Tympanic membrane and external ear normal.     Left Ear: Tympanic membrane and external ear normal.     Nose: Nose normal. No congestion or rhinorrhea.     Mouth/Throat:     Mouth: Mucous membranes are moist.  Eyes:     General:        Right eye: No discharge.        Left eye: No discharge.     Conjunctiva/sclera: Conjunctivae normal.     Pupils: Pupils are equal, round, and reactive to light.  Neck:     Thyroid: No thyromegaly.     Vascular: No JVD.  Cardiovascular:     Rate and Rhythm: Normal rate and regular rhythm.     Heart sounds: Normal heart sounds. No murmur heard.    No friction rub. No gallop.  Pulmonary:     Effort: Pulmonary effort is normal.     Breath sounds: Normal breath sounds. No wheezing or rhonchi.  Abdominal:     General: Bowel sounds are normal.     Palpations: Abdomen is soft. There is no mass.     Tenderness: There is no abdominal tenderness. There is no guarding.  Musculoskeletal:        General: Normal range of motion.     Cervical back: Normal range of motion and neck  supple.  Lymphadenopathy:     Cervical: No cervical adenopathy.  Skin:    General: Skin is warm and dry.  Neurological:     General: No focal deficit present.     Mental Status: She is alert.     Deep Tendon Reflexes: Reflexes are normal and symmetric.     Wt Readings from Last 3 Encounters:  09/12/22 127 lb (57.6 kg)  05/20/22 123 lb (55.8 kg)  05/01/22 123 lb (55.8 kg)    BP 128/78   Pulse 67   Ht '4\' 11"'$  (1.499 m)   Wt 127 lb (57.6 kg)   SpO2 98%   BMI 25.65 kg/m   Assessment and Plan:     Otilio Miu, MD

## 2022-09-13 LAB — RENAL FUNCTION PANEL
Albumin: 4.1 g/dL (ref 3.8–4.8)
BUN/Creatinine Ratio: 25 (ref 12–28)
BUN: 22 mg/dL (ref 8–27)
CO2: 23 mmol/L (ref 20–29)
Calcium: 9 mg/dL (ref 8.7–10.3)
Chloride: 106 mmol/L (ref 96–106)
Creatinine, Ser: 0.88 mg/dL (ref 0.57–1.00)
Glucose: 83 mg/dL (ref 70–99)
Phosphorus: 3.3 mg/dL (ref 3.0–4.3)
Potassium: 4.6 mmol/L (ref 3.5–5.2)
Sodium: 142 mmol/L (ref 134–144)
eGFR: 69 mL/min/{1.73_m2} (ref >59)

## 2022-10-10 ENCOUNTER — Telehealth: Payer: Self-pay | Admitting: Family Medicine

## 2022-10-10 NOTE — Telephone Encounter (Signed)
Copied from [place]. Topic: Medicare AWV >> Oct 10, 2022  2:46 PM Devoria Glassing wrote:  Reason for CRM: Left message tor patient to schedule Medicare Annual Wellness Visit (AWV) with [place], [place]  Appointment can be an offiice/telephone or virtual visit;  Please call [phone] ask for [facility].

## 2023-01-27 ENCOUNTER — Encounter: Payer: Self-pay | Admitting: Family Medicine

## 2023-01-27 ENCOUNTER — Ambulatory Visit (INDEPENDENT_AMBULATORY_CARE_PROVIDER_SITE_OTHER): Payer: Medicare Other | Admitting: Family Medicine

## 2023-01-27 VITALS — BP 120/76 | HR 90 | Ht 59.0 in | Wt 121.0 lb

## 2023-01-27 DIAGNOSIS — R233 Spontaneous ecchymoses: Secondary | ICD-10-CM | POA: Diagnosis not present

## 2023-01-27 NOTE — Progress Notes (Signed)
Date:  01/27/2023   Name:  Michelle Willis   DOB:  01/25/1951   MRN:  [id number]   Chief Complaint: Bleeding/Bruising (Place came up on L) arm)  Rash This is a new problem. The current episode started in the past 7 days. The problem is unchanged. Location: left forearm. The rash is characterized by bruising. Associated with: yard work. Pertinent negatives include no congestion, cough, diarrhea, fever, rhinorrhea, shortness of breath or sore throat.    Lab Results  Component Value Date   NA 142 09/12/2022   K 4.6 09/12/2022   CO2 23 09/12/2022   GLUCOSE 83 09/12/2022   BUN 22 09/12/2022   CREATININE 0.88 09/12/2022   CALCIUM 9.0 09/12/2022   EGFR 69 09/12/2022   GFRNONAA 73 08/31/2020   Lab Results  Component Value Date   CHOL 180 03/12/2022   HDL 51 03/12/2022   LDLCALC 100 (H) 03/12/2022   TRIG 166 (H) 03/12/2022   No results found for: "TSH" No results found for: "HGBA1C" No results found for: "WBC", "HGB", "HCT", "MCV", "PLT" Lab Results  Component Value Date   ALT 15 03/12/2022   AST 20 03/12/2022   ALKPHOS 92 03/12/2022   BILITOT <0.2 03/12/2022   No results found for: "25OHVITD2", "25OHVITD3", "VD25OH"   Review of Systems  Constitutional:  Negative for chills and fever.  HENT:  Negative for congestion, postnasal drip, rhinorrhea and sore throat.   Respiratory:  Negative for cough, shortness of breath and wheezing.   Cardiovascular:  Negative for chest pain and palpitations.  Gastrointestinal:  Negative for blood in stool and diarrhea.  Skin:  Positive for rash.  Neurological:  Negative for weakness and light-headedness.  Hematological:  Negative for adenopathy. Bruises/bleeds easily.    Patient Active Problem List   Diagnosis Date Noted   Gastroesophageal reflux disease with esophagitis without hemorrhage 09/06/2019   Problems with swallowing and mastication    Stricture and stenosis of esophagus    Encounter for screening colonoscopy    Primary  insomnia 03/13/2017   Essential hypertension 09/30/2016   Anxiety and depression 09/30/2016   Major depressive disorder, recurrent episode, moderate (HCC) 03/28/2015    No Known Allergies  Past Surgical History:  Procedure Laterality Date   ABDOMINAL HYSTERECTOMY     CHOLECYSTECTOMY     COLONOSCOPY WITH PROPOFOL N/A 06/24/2019   Procedure: COLONOSCOPY WITH PROPOFOL;  Surgeon: Midge Minium, MD;  Location: [facility];  Service: Endoscopy;  Laterality: N/A;   ESOPHAGOGASTRODUODENOSCOPY (EGD) WITH PROPOFOL N/A 07/16/2019   Procedure: ESOPHAGOGASTRODUODENOSCOPY (EGD) WITH DILATION;  Surgeon: Midge Minium, MD;  Location: [facility];  Service: Endoscopy;  Laterality: N/A;   VAGINAL HYSTERECTOMY      Social History   Tobacco Use   Smoking status: Never   Smokeless tobacco: Never  Vaping Use   Vaping Use: Never used  Substance Use Topics   Alcohol use: No    Alcohol/week: 0.0 standard drinks of alcohol   Drug use: No     Medication list has been reviewed and updated.  Current Meds  Medication Sig   amitriptyline (ELAVIL) 50 MG tablet Take 1 tablet (50 mg total) by mouth at bedtime.   Ascorbic Acid (VITAMIN C ADULT GUMMIES PO)    busPIRone (BUSPAR) 7.5 MG tablet Take 1 tablet (7.5 mg total) by mouth 2 (two) times daily.   Calcium Carbonate (CALCIUM 600 PO) Take 1 tablet by mouth daily.   Cholecalciferol (VITAMIN D3 ADULT GUMMIES PO) Take 1  tablet by mouth daily.    citalopram (CELEXA) 20 MG tablet Take 1 tablet (20 mg total) by mouth daily.   ezetimibe (ZETIA) 10 MG tablet Take 1 tablet (10 mg total) by mouth daily.   pantoprazole (PROTONIX) 40 MG tablet Take 1 tablet (40 mg total) by mouth daily.       01/27/2023    9:12 AM 09/12/2022    9:17 AM 05/20/2022    3:44 PM 05/01/2022    1:23 PM  GAD 7 : Generalized Anxiety Score  Nervous, Anxious, on Edge 0 0 0 0  Control/stop worrying 0 0 0 0  Worry too much - different things 0 0 0 0  Trouble relaxing 0 0 0 0   Restless 0 0 0 0  Easily annoyed or irritable 0 0 0 0  Afraid - awful might happen 0 0 0 0  Total GAD 7 Score 0 0 0 0  Anxiety Difficulty Not difficult at all Not difficult at all Not difficult at all Not difficult at all       01/27/2023    9:12 AM 09/12/2022    9:16 AM 05/20/2022    3:44 PM  Depression screen PHQ 2/9  Decreased Interest 0 0 0  Down, Depressed, Hopeless 0 0 0  PHQ - 2 Score 0 0 0  Altered sleeping 0 0 0  Tired, decreased energy 0 0 0  Change in appetite 0 0 0  Feeling bad or failure about yourself  0 0 0  Trouble concentrating 0 0 0  Moving slowly or fidgety/restless 0 0 0  Suicidal thoughts 0 0 0  PHQ-9 Score 0 0 0  Difficult doing work/chores Not difficult at all Not difficult at all Not difficult at all    BP Readings from Last 3 Encounters:  01/27/23 120/76  09/12/22 128/78  05/20/22 120/80    Physical Exam Vitals and nursing note reviewed.  Constitutional:      Appearance: She is well-developed.  HENT:     Head: Normocephalic.     Right Ear: Tympanic membrane, ear canal and external ear normal.     Left Ear: Tympanic membrane, ear canal and external ear normal.     Mouth/Throat:     Lips: Pink.     Mouth: Mucous membranes are moist.     Tongue: No lesions.     Palate: No mass.     Pharynx: Oropharynx is clear.  Eyes:     General: Lids are everted, no foreign bodies appreciated. No scleral icterus.       Left eye: No foreign body or hordeolum.     Conjunctiva/sclera: Conjunctivae normal.     Right eye: Right conjunctiva is not injected.     Left eye: Left conjunctiva is not injected.     Pupils: Pupils are equal, round, and reactive to light.  Neck:     Thyroid: No thyromegaly.     Vascular: No JVD.     Trachea: No tracheal deviation.  Cardiovascular:     Rate and Rhythm: Normal rate and regular rhythm.     Heart sounds: Normal heart sounds, S1 normal and S2 normal. No murmur heard.    No systolic murmur is present.     No diastolic  murmur is present.     No friction rub. No gallop. No S3 or S4 sounds.  Pulmonary:     Effort: Pulmonary effort is normal. No respiratory distress.     Breath sounds: Normal breath sounds.  No decreased breath sounds, wheezing, rhonchi or rales.  Abdominal:     General: Bowel sounds are normal.     Palpations: Abdomen is soft. There is no mass.     Tenderness: There is no abdominal tenderness. There is no guarding or rebound.  Musculoskeletal:        General: No tenderness. Normal range of motion.     Cervical back: Normal range of motion and neck supple.     Right lower leg: No edema.     Left lower leg: No edema.  Lymphadenopathy:     Cervical: No cervical adenopathy.  Skin:    General: Skin is warm.     Findings: Bruising present. No rash.  Neurological:     Mental Status: She is alert and oriented to person, place, and time.     Cranial Nerves: No cranial nerve deficit.     Deep Tendon Reflexes: Reflexes normal.  Psychiatric:        Mood and Affect: Mood is not anxious or depressed.     Wt Readings from Last 3 Encounters:  01/27/23 121 lb (54.9 kg)  09/12/22 127 lb (57.6 kg)  05/20/22 123 lb (55.8 kg)    BP 120/76   Pulse 90   Ht 4\' 11"  (1.499 m)   Wt 121 lb (54.9 kg)   SpO2 99%   BMI 24.44 kg/m   Assessment and Plan:  1. Bruising, spontaneous New onset.  Episodic.  Stable and that there is a new bruise at all times.  This could be secondary to her taking aspirin and working in the yard and she has a dog that can be bruising.  However given the fact that she has never had a CBC and we are worried about whether this may be purpura we will proceed with further evaluation by looking at a CBC to rule out myelo proliferative concern or thrombocytopenia. - CBC with Differential/Platelet    Elizabeth Sauer, MD

## 2023-01-28 LAB — CBC WITH DIFFERENTIAL/PLATELET
Basophils Absolute: 0.1 10*3/uL (ref 0.0–0.2)
Basos: 2 %
EOS (ABSOLUTE): 0.4 10*3/uL (ref 0.0–0.4)
Eos: 6 %
Hematocrit: 41 % (ref 34.0–46.6)
Hemoglobin: 13.5 g/dL (ref 11.1–15.9)
Immature Grans (Abs): 0 10*3/uL (ref 0.0–0.1)
Immature Granulocytes: 0 %
Lymphocytes Absolute: 1.4 10*3/uL (ref 0.7–3.1)
Lymphs: 23 %
MCH: 28.3 pg (ref 26.6–33.0)
MCHC: 32.9 g/dL (ref 31.5–35.7)
MCV: 86 fL (ref 79–97)
Monocytes Absolute: 0.6 10*3/uL (ref 0.1–0.9)
Monocytes: 10 %
Neutrophils Absolute: 3.4 10*3/uL (ref 1.4–7.0)
Neutrophils: 59 %
Platelets: 266 10*3/uL (ref 150–450)
RBC: 4.77 x10E6/uL (ref 3.77–5.28)
RDW: 13.9 % (ref 11.7–15.4)
WBC: 5.9 10*3/uL (ref 3.4–10.8)

## 2023-02-12 ENCOUNTER — Telehealth: Payer: Self-pay | Admitting: Family Medicine

## 2023-02-12 NOTE — Telephone Encounter (Signed)
Left voice mail to set up awv with nurse next Wednesday. 

## 2023-02-21 ENCOUNTER — Ambulatory Visit (INDEPENDENT_AMBULATORY_CARE_PROVIDER_SITE_OTHER): Payer: Medicare Other | Admitting: Family Medicine

## 2023-02-21 ENCOUNTER — Encounter: Payer: Self-pay | Admitting: Family Medicine

## 2023-02-21 VITALS — BP 104/70 | HR 86 | Ht 59.0 in | Wt 120.0 lb

## 2023-02-21 DIAGNOSIS — K2100 Gastro-esophageal reflux disease with esophagitis, without bleeding: Secondary | ICD-10-CM | POA: Diagnosis not present

## 2023-02-21 DIAGNOSIS — F419 Anxiety disorder, unspecified: Secondary | ICD-10-CM | POA: Diagnosis not present

## 2023-02-21 DIAGNOSIS — E782 Mixed hyperlipidemia: Secondary | ICD-10-CM | POA: Diagnosis not present

## 2023-02-21 DIAGNOSIS — F5101 Primary insomnia: Secondary | ICD-10-CM

## 2023-02-21 DIAGNOSIS — F32A Depression, unspecified: Secondary | ICD-10-CM

## 2023-02-21 MED ORDER — AMITRIPTYLINE HCL 50 MG PO TABS: 50.0000 mg | ORAL_TABLET | Freq: Every day | ORAL | 1 refills | Status: DC

## 2023-02-21 MED ORDER — EZETIMIBE 10 MG PO TABS: 10.0000 mg | ORAL_TABLET | Freq: Every day | ORAL | 1 refills | Status: DC

## 2023-02-21 MED ORDER — BUSPIRONE HCL 7.5 MG PO TABS: 7.5000 mg | ORAL_TABLET | Freq: Two times a day (BID) | ORAL | 1 refills | Status: DC

## 2023-02-21 MED ORDER — CITALOPRAM HYDROBROMIDE 20 MG PO TABS: 20.0000 mg | ORAL_TABLET | Freq: Every day | ORAL | 1 refills | Status: DC

## 2023-02-21 MED ORDER — PANTOPRAZOLE SODIUM 40 MG PO TBEC: 40.0000 mg | DELAYED_RELEASE_TABLET | Freq: Every day | ORAL | 1 refills | Status: DC

## 2023-02-21 NOTE — Progress Notes (Signed)
Date:  02/21/2023   Name:  Michelle Willis   DOB:  1951-01-04   MRN:  [id number]   Chief Complaint: Insomnia, Depression, Hyperlipidemia, and Gastroesophageal Reflux  Insomnia Primary symptoms: no difficulty falling asleep.   The problem has been gradually improving since onset. Past treatments include medication (amitryptaline). The treatment provided moderate relief. PMH includes: no hypertension, depression.   Depression        This is a chronic problem.  The problem occurs daily.  The problem has been gradually improving since onset.  Associated symptoms include insomnia.  Associated symptoms include no decreased concentration, no fatigue, no helplessness, no hopelessness, not irritable, no restlessness, no decreased interest, no appetite change, no body aches, no myalgias, no headaches, no indigestion, not sad and no suicidal ideas.     The symptoms are aggravated by nothing.  Past treatments include SSRIs - Selective serotonin reuptake inhibitors.  Compliance with treatment is good.   Pertinent negatives include no hypothyroidism. Hyperlipidemia This is a chronic problem. The current episode started more than 1 year ago. The problem is controlled. Recent lipid tests were reviewed and are normal. She has no history of diabetes or hypothyroidism. Pertinent negatives include no chest pain, leg pain, myalgias or shortness of breath. Current antihyperlipidemic treatment includes statins. The current treatment provides moderate improvement of lipids. There are no compliance problems.  Risk factors for coronary artery disease include dyslipidemia.  Gastroesophageal Reflux She reports no abdominal pain, no chest pain, no coughing, no dysphagia, no globus sensation, no heartburn, no nausea, no sore throat or no wheezing. This is a chronic problem. The current episode started more than 1 year ago. The problem has been gradually improving. The symptoms are aggravated by certain foods. Pertinent negatives  include no fatigue. She has tried a PPI for the symptoms. The treatment provided moderate relief.    Lab Results  Component Value Date   NA 142 09/12/2022   K 4.6 09/12/2022   CO2 23 09/12/2022   GLUCOSE 83 09/12/2022   BUN 22 09/12/2022   CREATININE 0.88 09/12/2022   CALCIUM 9.0 09/12/2022   EGFR 69 09/12/2022   GFRNONAA 73 08/31/2020   Lab Results  Component Value Date   CHOL 180 03/12/2022   HDL 51 03/12/2022   LDLCALC 100 (H) 03/12/2022   TRIG 166 (H) 03/12/2022   No results found for: "TSH" No results found for: "HGBA1C" Lab Results  Component Value Date   WBC 5.9 01/27/2023   HGB 13.5 01/27/2023   HCT 41.0 01/27/2023   MCV 86 01/27/2023   PLT 266 01/27/2023   Lab Results  Component Value Date   ALT 15 03/12/2022   AST 20 03/12/2022   ALKPHOS 92 03/12/2022   BILITOT <0.2 03/12/2022   No results found for: "25OHVITD2", "25OHVITD3", "VD25OH"   Review of Systems  Constitutional:  Negative for appetite change, fatigue and unexpected weight change.  HENT:  Negative for sore throat and trouble swallowing.   Eyes:  Negative for visual disturbance.  Respiratory:  Negative for cough, shortness of breath and wheezing.   Cardiovascular:  Negative for chest pain, palpitations and leg swelling.  Gastrointestinal:  Negative for abdominal pain, blood in stool, dysphagia, heartburn and nausea.  Endocrine: Negative for polydipsia and polyuria.  Genitourinary:  Negative for difficulty urinating and vaginal bleeding.  Musculoskeletal:  Negative for myalgias.  Skin:  Negative for color change.  Neurological:  Negative for headaches.  Hematological:  Negative for adenopathy. Does not bruise/bleed  easily.  Psychiatric/Behavioral:  Positive for depression. Negative for decreased concentration and suicidal ideas. The patient has insomnia.     Patient Active Problem List   Diagnosis Date Noted   Gastroesophageal reflux disease with esophagitis without hemorrhage 09/06/2019    Problems with swallowing and mastication    Stricture and stenosis of esophagus    Encounter for screening colonoscopy    Primary insomnia 03/13/2017   Essential hypertension 09/30/2016   Anxiety and depression 09/30/2016   Major depressive disorder, recurrent episode, moderate (HCC) 03/28/2015    No Known Allergies  Past Surgical History:  Procedure Laterality Date   ABDOMINAL HYSTERECTOMY     CHOLECYSTECTOMY     COLONOSCOPY WITH PROPOFOL N/A 06/24/2019   Procedure: COLONOSCOPY WITH PROPOFOL;  Surgeon: Midge Minium, MD;  Location: [facility];  Service: Endoscopy;  Laterality: N/A;   ESOPHAGOGASTRODUODENOSCOPY (EGD) WITH PROPOFOL N/A 07/16/2019   Procedure: ESOPHAGOGASTRODUODENOSCOPY (EGD) WITH DILATION;  Surgeon: Midge Minium, MD;  Location: [facility];  Service: Endoscopy;  Laterality: N/A;   VAGINAL HYSTERECTOMY      Social History   Tobacco Use   Smoking status: Never   Smokeless tobacco: Never  Vaping Use   Vaping Use: Never used  Substance Use Topics   Alcohol use: No    Alcohol/week: 0.0 standard drinks of alcohol   Drug use: No     Medication list has been reviewed and updated.  Current Meds  Medication Sig   amitriptyline (ELAVIL) 50 MG tablet Take 1 tablet (50 mg total) by mouth at bedtime.   Ascorbic Acid (VITAMIN C ADULT GUMMIES PO)    busPIRone (BUSPAR) 7.5 MG tablet Take 1 tablet (7.5 mg total) by mouth 2 (two) times daily.   Calcium Carbonate (CALCIUM 600 PO) Take 1 tablet by mouth daily.   Cholecalciferol (VITAMIN D3 ADULT GUMMIES PO) Take 1 tablet by mouth daily.    citalopram (CELEXA) 20 MG tablet Take 1 tablet (20 mg total) by mouth daily.   ezetimibe (ZETIA) 10 MG tablet Take 1 tablet (10 mg total) by mouth daily.   pantoprazole (PROTONIX) 40 MG tablet Take 1 tablet (40 mg total) by mouth daily.       02/21/2023    9:04 AM 01/27/2023    9:12 AM 09/12/2022    9:17 AM 05/20/2022    3:44 PM  GAD 7 : Generalized Anxiety Score   Nervous, Anxious, on Edge 0 0 0 0  Control/stop worrying 0 0 0 0  Worry too much - different things 0 0 0 0  Trouble relaxing 0 0 0 0  Restless 0 0 0 0  Easily annoyed or irritable 0 0 0 0  Afraid - awful might happen 0 0 0 0  Total GAD 7 Score 0 0 0 0  Anxiety Difficulty Not difficult at all Not difficult at all Not difficult at all Not difficult at all       02/21/2023    9:04 AM 01/27/2023    9:12 AM 09/12/2022    9:16 AM  Depression screen PHQ 2/9  Decreased Interest 0 0 0  Down, Depressed, Hopeless 0 0 0  PHQ - 2 Score 0 0 0  Altered sleeping 0 0 0  Tired, decreased energy 0 0 0  Change in appetite 0 0 0  Feeling bad or failure about yourself  0 0 0  Trouble concentrating 0 0 0  Moving slowly or fidgety/restless 0 0 0  Suicidal thoughts 0 0 0  PHQ-9  Score 0 0 0  Difficult doing work/chores Not difficult at all Not difficult at all Not difficult at all    BP Readings from Last 3 Encounters:  02/21/23 104/70  01/27/23 120/76  09/12/22 128/78    Physical Exam Vitals and nursing note reviewed. Exam conducted with a chaperone present.  Constitutional:      General: She is not irritable.She is not in acute distress.    Appearance: She is not diaphoretic.  HENT:     Head: Normocephalic and atraumatic.     Right Ear: Tympanic membrane and external ear normal.     Left Ear: Tympanic membrane and external ear normal.     Nose: Nose normal.     Mouth/Throat:     Mouth: Mucous membranes are moist.     Pharynx: Oropharynx is clear. No oropharyngeal exudate or posterior oropharyngeal erythema.  Eyes:     General:        Right eye: No discharge.        Left eye: No discharge.     Conjunctiva/sclera: Conjunctivae normal.     Pupils: Pupils are equal, round, and reactive to light.  Neck:     Thyroid: No thyromegaly.     Vascular: No JVD.  Cardiovascular:     Rate and Rhythm: Normal rate and regular rhythm.     Heart sounds: Normal heart sounds. No murmur heard.    No  friction rub. No gallop.  Pulmonary:     Effort: Pulmonary effort is normal.     Breath sounds: Normal breath sounds. No wheezing, rhonchi or rales.  Abdominal:     General: Bowel sounds are normal.     Palpations: Abdomen is soft. There is no mass.     Tenderness: There is no abdominal tenderness. There is no guarding or rebound.  Musculoskeletal:        General: Normal range of motion.     Cervical back: Normal range of motion and neck supple.  Lymphadenopathy:     Cervical: No cervical adenopathy.  Skin:    General: Skin is warm and dry.  Neurological:     Mental Status: She is alert.     Deep Tendon Reflexes: Reflexes are normal and symmetric.     Wt Readings from Last 3 Encounters:  02/21/23 120 lb (54.4 kg)  01/27/23 121 lb (54.9 kg)  09/12/22 127 lb (57.6 kg)    BP 104/70   Pulse 86   Ht 4\' 11"  (1.499 m)   Wt 120 lb (54.4 kg)   SpO2 98%   BMI 24.24 kg/m   Assessment and Plan:  1. Anxiety and depression Chronic.  Controlled.  Stable.  PHQ is 0 GAD score is 0 continue Celexa 20 mg once a day and buspirone 7.5 mg 1 twice a day.  Will recheck patient in 6 months. - busPIRone (BUSPAR) 7.5 MG tablet; Take 1 tablet (7.5 mg total) by mouth 2 (two) times daily.  Dispense: 180 tablet; Refill: 1 - citalopram (CELEXA) 20 MG tablet; Take 1 tablet (20 mg total) by mouth daily.  Dispense: 90 tablet; Refill: 1  2. Mixed hyperlipidemia Chronic.  Controlled.  Stable.  Asymptomatic patient tolerating.  Continue Zetia 10 mg once a day.  Will check lipid panel current level of LDL and CMP for hepatic concern.  Will recheck patient in 6 months. - ezetimibe (ZETIA) 10 MG tablet; Take 1 tablet (10 mg total) by mouth daily.  Dispense: 90 tablet; Refill: 1 - Lipid Panel  With LDL/HDL Ratio - Comprehensive Metabolic Panel (CMET)  3. Gastroesophageal reflux disease with esophagitis without hemorrhage Chronic.  Controlled.  Stable.  Continue pantoprazole 40 mg once a day.  Will recheck in 6  months. - pantoprazole (PROTONIX) 40 MG tablet; Take 1 tablet (40 mg total) by mouth daily.  Dispense: 90 tablet; Refill: 1  4. Primary insomnia Chronic.  Controlled.  Stable.  Initiating sleep is improved since amitriptyline 50 mg nightly.  Will recheck patient in 6 months. - amitriptyline (ELAVIL) 50 MG tablet; Take 1 tablet (50 mg total) by mouth at bedtime.  Dispense: 90 tablet; Refill: 1    Elizabeth Sauer, MD

## 2023-02-24 DIAGNOSIS — E782 Mixed hyperlipidemia: Secondary | ICD-10-CM | POA: Diagnosis not present

## 2023-02-25 LAB — COMPREHENSIVE METABOLIC PANEL
ALT: 11 IU/L (ref 0–32)
AST: 13 IU/L (ref 0–40)
Albumin: 4.4 g/dL (ref 3.8–4.8)
Alkaline Phosphatase: 72 IU/L (ref 44–121)
BUN/Creatinine Ratio: 19 (ref 12–28)
BUN: 17 mg/dL (ref 8–27)
Bilirubin Total: 0.3 mg/dL (ref 0.0–1.2)
CO2: 23 mmol/L (ref 20–29)
Calcium: 9.2 mg/dL (ref 8.7–10.3)
Chloride: 106 mmol/L (ref 96–106)
Creatinine, Ser: 0.88 mg/dL (ref 0.57–1.00)
Globulin, Total: 1.7 g/dL (ref 1.5–4.5)
Glucose: 117 mg/dL — ABNORMAL HIGH (ref 70–99)
Potassium: 4.6 mmol/L (ref 3.5–5.2)
Sodium: 141 mmol/L (ref 134–144)
Total Protein: 6.1 g/dL (ref 6.0–8.5)
eGFR: 69 mL/min/{1.73_m2} (ref >59)

## 2023-02-25 LAB — LIPID PANEL WITH LDL/HDL RATIO
Cholesterol, Total: 190 mg/dL (ref 100–199)
HDL: 67 mg/dL (ref >39)
LDL Chol Calc (NIH): 91 mg/dL (ref 0–99)
LDL/HDL Ratio: 1.4 ratio (ref 0.0–3.2)
Triglycerides: 189 mg/dL — ABNORMAL HIGH (ref 0–149)
VLDL Cholesterol Cal: 32 mg/dL (ref 5–40)

## 2023-03-13 ENCOUNTER — Ambulatory Visit: Payer: Medicare Other | Admitting: Family Medicine

## 2023-03-19 ENCOUNTER — Ambulatory Visit (INDEPENDENT_AMBULATORY_CARE_PROVIDER_SITE_OTHER): Payer: Medicare Other

## 2023-03-19 VITALS — Ht 59.0 in | Wt 120.0 lb

## 2023-03-19 DIAGNOSIS — Z0000 Encounter for general adult medical examination without abnormal findings: Secondary | ICD-10-CM | POA: Diagnosis not present

## 2023-03-19 NOTE — Patient Instructions (Signed)
[name] , Thank you for taking time to come for your Medicare Wellness Visit. I appreciate your ongoing commitment to your health goals. Please review the following plan we discussed and let me know if I can assist you in the future.   These are the goals we discussed:  Goals       DIET - INCREASE WATER INTAKE      Recommend to drink at least 6-8 8oz glasses of water per day      Oral Health Maintained (pt-stated)      Evidence-based guidance:  Assess and address barriers to oral health or dental care; consider attitude, knowledge, child's age, development, available support or role-modeling.  Identify oral health needs by review of risk screen.  Encourage meticulous routine oral care that includes wiping, brushing (with an electric toothbrush if available), flossing and preventive care.  Encourage appropriate amount of fluoridated toothpaste (?osmear? until age 58, pea-sized amount ages 4 and older).  Promote age-appropriate use of xylitol-containing products throughout the day such as toothpaste, mouthwash, candies, mints or chewing gum.  Anticipate fluoride supplementation either parent administered and/or professionally applied.  Encourage breastfeeding mothers to continue up to age 40 months.  Provide anticipatory guidance regarding interplay between oral health and disease.  Encourage use of dental mouthguards when participating in contact sports.  Assist patient or parent to obtain dental care.   Notes: "stay as healthy as I can"        This is a list of the screening recommended for you and due dates:  Health Maintenance  Topic Date Due   COVID-19 Vaccine (3 - 2023-24 season) 04/04/2023*   Zoster (Shingles) Vaccine (1 of 2) 04/29/2023*   Pneumonia Vaccine (1 of 1 - PCV) 03/18/2024*   Mammogram  03/18/2024*   Hepatitis C Screening  03/18/2024*   Flu Shot  03/27/2023   Medicare Annual Wellness Visit  03/18/2024   Colon Cancer Screening  06/23/2029   DEXA scan (bone  density measurement)  Completed   HPV Vaccine  Aged Out   DTaP/Tdap/Td vaccine  Discontinued  *Topic was postponed. The date shown is not the original due date.   Health Maintenance After Age 88 After age 46, you are at a higher risk for certain long-term diseases and infections as well as injuries from falls. Falls are a major cause of broken bones and head injuries in people who are older than age 62. Getting regular preventive care can help to keep you healthy and well. Preventive care includes getting regular testing and making lifestyle changes as recommended by your health care provider. Talk with your health care provider about: Which screenings and tests you should have. A screening is a test that checks for a disease when you have no symptoms. A diet and exercise plan that is right for you. What should I know about screenings and tests to prevent falls? Screening and testing are the best ways to find a health problem early. Early diagnosis and treatment give you the best chance of managing medical conditions that are common after age 37. Certain conditions and lifestyle choices may make you more likely to have a fall. Your health care provider may recommend: Regular vision checks. Poor vision and conditions such as cataracts can make you more likely to have a fall. If you wear glasses, make sure to get your prescription updated if your vision changes. Medicine review. Work with your health care provider to regularly review all of the medicines you are taking, including  over-the-counter medicines. Ask your health care provider about any side effects that may make you more likely to have a fall. Tell your health care provider if any medicines that you take make you feel dizzy or sleepy. Strength and balance checks. Your health care provider may recommend certain tests to check your strength and balance while standing, walking, or changing positions. Foot health exam. Foot pain and numbness, as  well as not wearing proper footwear, can make you more likely to have a fall. Screenings, including: Osteoporosis screening. Osteoporosis is a condition that causes the bones to get weaker and break more easily. Blood pressure screening. Blood pressure changes and medicines to control blood pressure can make you feel dizzy. Depression screening. You may be more likely to have a fall if you have a fear of falling, feel depressed, or feel unable to do activities that you used to do. Alcohol use screening. Using too much alcohol can affect your balance and may make you more likely to have a fall. Follow these instructions at home: Lifestyle Do not drink alcohol if: Your health care provider tells you not to drink. If you drink alcohol: Limit how much you have to: 0-1 drink a day for women. 0-2 drinks a day for men. Know how much alcohol is in your drink. In the [place] one drink equals one 12 oz bottle of beer (355 mL), one 5 oz glass of wine (148 mL), or one 1 oz glass of hard liquor (44 mL). Do not use any products that contain nicotine or tobacco. These products include cigarettes, chewing tobacco, and vaping devices, such as e-cigarettes. If you need help quitting, ask your health care provider. Activity  Follow a regular exercise program to stay fit. This will help you maintain your balance. Ask your health care provider what types of exercise are appropriate for you. If you need a cane or walker, use it as recommended by your health care provider. Wear supportive shoes that have nonskid soles. Safety  Remove any tripping hazards, such as rugs, cords, and clutter. Install safety equipment such as grab bars in bathrooms and safety rails on stairs. Keep rooms and walkways well-lit. General instructions Talk with your health care provider about your risks for falling. Tell your health care provider if: You fall. Be sure to tell your health care provider about all falls, even ones that seem  minor. You feel dizzy, tiredness (fatigue), or off-balance. Take over-the-counter and prescription medicines only as told by your health care provider. These include supplements. Eat a healthy diet and maintain a healthy weight. A healthy diet includes low-fat dairy products, low-fat (lean) meats, and fiber from whole grains, beans, and lots of fruits and vegetables. Stay current with your vaccines. Schedule regular health, dental, and eye exams. Summary Having a healthy lifestyle and getting preventive care can help to protect your health and wellness after age 45. Screening and testing are the best way to find a health problem early and help you avoid having a fall. Early diagnosis and treatment give you the best chance for managing medical conditions that are more common for people who are older than age 18. Falls are a major cause of broken bones and head injuries in people who are older than age 60. Take precautions to prevent a fall at home. Work with your health care provider to learn what changes you can make to improve your health and wellness and to prevent falls. This information is not intended to replace advice given  to you by your health care provider. Make sure you discuss any questions you have with your health care provider. Document Revised: 01/01/2021 Document Reviewed: 01/01/2021 Elsevier Patient Education  2024 [employer].

## 2023-03-19 NOTE — Progress Notes (Signed)
Subjective:   Michelle Willis is a 72 y.o. female who presents for Medicare Annual (Subsequent) preventive examination.  Visit Complete: Virtual  I connected with  [name] on 03/19/23 by a audio enabled telemedicine application and verified that I am speaking with the correct person using two identifiers.  Patient Location: Home  Provider Location: Office/Clinic  I discussed the limitations of evaluation and management by telemedicine. The patient expressed understanding and agreed to proceed.  Cardiac Risk Factors include: advanced age (>52men, >25 women);dyslipidemia     Objective:    Today's Vitals   03/19/23 0907  Weight: 120 lb (54.4 kg)  Height: 4\' 11"  (1.499 m)   Body mass index is 24.24 kg/m.     03/19/2023    9:44 AM 07/16/2019    7:49 AM 06/24/2019    8:02 AM 12/30/2018    1:26 PM 09/03/2017   11:35 AM 03/28/2015    1:48 PM  Advanced Directives  Does Patient Have a Medical Advance Directive? No Yes Yes Yes Yes Yes  Type of Special educational needs teacher of [place];Living will Healthcare Power of [place];Living will Healthcare Power of [place];Living will Healthcare Power of [place];Living will Living will  Does patient want to make changes to medical advance directive?  No - Patient declined No - Patient declined     Copy of Healthcare Power of Attorney in Chart?  No - copy requested No - copy requested No - copy requested No - copy requested No - copy requested  Would patient like information on creating a medical advance directive? No - Patient declined         Current Medications (verified) Outpatient Encounter Medications as of 03/19/2023  Medication Sig   amitriptyline (ELAVIL) 50 MG tablet Take 1 tablet (50 mg total) by mouth at bedtime.   Ascorbic Acid (VITAMIN C ADULT GUMMIES PO)    busPIRone (BUSPAR) 7.5 MG tablet Take 1 tablet (7.5 mg total) by mouth 2 (two) times daily.   Calcium Carbonate (CALCIUM 600 PO) Take 1 tablet by mouth daily.    Cholecalciferol (VITAMIN D3 ADULT GUMMIES PO) Take 1 tablet by mouth daily.    citalopram (CELEXA) 20 MG tablet Take 1 tablet (20 mg total) by mouth daily.   ezetimibe (ZETIA) 10 MG tablet Take 1 tablet (10 mg total) by mouth daily.   pantoprazole (PROTONIX) 40 MG tablet Take 1 tablet (40 mg total) by mouth daily.   No facility-administered encounter medications on file as of 03/19/2023.    Allergies (verified) Patient has no known allergies.   History: Past Medical History:  Diagnosis Date   Anxiety    Cancer (HCC) 1991   basal cell ca   Depression    GERD (gastroesophageal reflux disease)    Past Surgical History:  Procedure Laterality Date   ABDOMINAL HYSTERECTOMY     CHOLECYSTECTOMY     COLONOSCOPY WITH PROPOFOL N/A 06/24/2019   Procedure: COLONOSCOPY WITH PROPOFOL;  Surgeon: Midge Minium, MD;  Location: [facility];  Service: Endoscopy;  Laterality: N/A;   ESOPHAGOGASTRODUODENOSCOPY (EGD) WITH PROPOFOL N/A 07/16/2019   Procedure: ESOPHAGOGASTRODUODENOSCOPY (EGD) WITH DILATION;  Surgeon: Midge Minium, MD;  Location: [facility];  Service: Endoscopy;  Laterality: N/A;   VAGINAL HYSTERECTOMY     Family History  Problem Relation Age of Onset   Cancer Father    Breast cancer Neg Hx    Social History   Socioeconomic History   Marital status: Divorced    Spouse name:  Not on file   Number of children: 3   Years of education: some college   Highest education level: Some college, no degree  Occupational History   Occupation: Retired  Tobacco Use   Smoking status: Never   Smokeless tobacco: Never  Vaping Use   Vaping status: Never Used  Substance and Sexual Activity   Alcohol use: No    Alcohol/week: 0.0 standard drinks of alcohol   Drug use: No   Sexual activity: Not Currently  Other Topics Concern   Not on file  Social History Narrative   Not on file   Social Determinants of Health   Financial Resource Strain: Low Risk  (03/19/2023)   Overall  Financial Resource Strain (CARDIA)    Difficulty of Paying Living Expenses: Not hard at all  Food Insecurity: No Food Insecurity (03/19/2023)   Hunger Vital Sign    Worried About Running Out of Food in the Last Year: Never true    Ran Out of Food in the Last Year: Never true  Transportation Needs: No Transportation Needs (03/19/2023)   PRAPARE - Administrator, Civil Service (Medical): No    Lack of Transportation (Non-Medical): No  Physical Activity: Insufficiently Active (03/19/2023)   Exercise Vital Sign    Days of Exercise per Week: 6 days    Minutes of Exercise per Session: 20 min  Stress: No Stress Concern Present (03/19/2023)   [employer] of Occupational Health - Occupational Stress Questionnaire    Feeling of Stress : Not at all  Social Connections: Moderately Isolated (03/19/2023)   Social Connection and Isolation Panel [NHANES]    Frequency of Communication with Friends and Family: More than three times a week    Frequency of Social Gatherings with Friends and Family: More than three times a week    Attends Religious Services: Never    Database administrator or Organizations: No    Attends Engineer, structural: Never    Marital Status: Living with partner    Tobacco Counseling Counseling given: Yes   Clinical Intake:     Pain : No/denies pain     BMI - recorded: 24.24 Nutritional Status: BMI of 19-24  Normal Nutritional Risks: None Diabetes: No  How often do you need to have someone help you when you read instructions, pamphlets, or other written materials from your doctor or pharmacy?: 1 - Never  Interpreter Needed?: No  Information entered by :: Arthur Holms   Activities of Daily Living    03/19/2023    9:39 AM  In your present state of health, do you have any difficulty performing the following activities:  Hearing? 0  Vision? 0  Difficulty concentrating or making decisions? 0  Walking or climbing stairs? 0  Dressing or  bathing? 0  Doing errands, shopping? 0  Preparing Food and eating ? N  Using the Toilet? N  In the past six months, have you accidently leaked urine? N  Do you have problems with loss of bowel control? N  Managing your Medications? N  Managing your Finances? N  Housekeeping or managing your Housekeeping? N    Patient Care Team: Duanne Limerick, MD as PCP - General (Family Medicine)  Indicate any recent Medical Services you may have received from other than [facility] providers in the past year (date may be approximate).     Assessment:   This is a routine wellness examination for [place].  Hearing/Vision screen Hearing Screening - Comments:: No issues  hearing over the phone Vision Screening - Comments:: Wears readers  Dietary issues and exercise activities discussed:     Goals Addressed               This Visit's Progress     Oral Health Maintained (pt-stated)        Evidence-based guidance:  Assess and address barriers to oral health or dental care; consider attitude, knowledge, child's age, development, available support or role-modeling.  Identify oral health needs by review of risk screen.  Encourage meticulous routine oral care that includes wiping, brushing (with an electric toothbrush if available), flossing and preventive care.  Encourage appropriate amount of fluoridated toothpaste (?osmear? until age 44, pea-sized amount ages 63 and older).  Promote age-appropriate use of xylitol-containing products throughout the day such as toothpaste, mouthwash, candies, mints or chewing gum.  Anticipate fluoride supplementation either parent administered and/or professionally applied.  Encourage breastfeeding mothers to continue up to age 27 months.  Provide anticipatory guidance regarding interplay between oral health and disease.  Encourage use of dental mouthguards when participating in contact sports.  Assist patient or parent to obtain dental care.   Notes: "stay as  healthy as I can"       Depression Screen    03/19/2023    9:43 AM 02/21/2023    9:04 AM 01/27/2023    9:12 AM 09/12/2022    9:16 AM 05/20/2022    3:44 PM 05/01/2022    1:23 PM 03/29/2022   10:55 AM  PHQ 2/9 Scores  PHQ - 2 Score 0 0 0 0 0 0 0  PHQ- 9 Score 0 0 0 0 0 0 0    Fall Risk    03/19/2023    9:45 AM 02/21/2023    9:04 AM 01/27/2023    9:12 AM 09/12/2022    9:16 AM 05/20/2022    3:44 PM  Fall Risk   Falls in the past year? 0 0 0 0 0  Number falls in past yr: 0 0 0 0 0  Injury with Fall? 0 0 0 0 0  Risk for fall due to : No Fall Risks No Fall Risks No Fall Risks No Fall Risks No Fall Risks  Follow up Falls evaluation completed Falls evaluation completed Falls evaluation completed Falls evaluation completed Falls evaluation completed    MEDICARE RISK AT HOME:  Medicare Risk at Home - 03/19/23 0945     Any stairs in or around the home? No    If so, are there any without handrails? No    Home free of loose throw rugs in walkways, pet beds, electrical cords, etc? Yes    Adequate lighting in your home to reduce risk of falls? Yes    Life alert? No   advised to get   Use of a cane, walker or w/c? No    Grab bars in the bathroom? No    Shower chair or bench in shower? No    Elevated toilet seat or a handicapped toilet? No               Cognitive Function:        03/19/2023    9:45 AM 12/30/2018    1:30 PM 09/03/2017   11:40 AM  6CIT Screen  What Year? 0 points 0 points 0 points  What month? 0 points 0 points 0 points  What time? 0 points 0 points 0 points  Count back from 20 0 points 0 points 0 points  Months in reverse 0 points 0 points 0 points  Repeat phrase 0 points 0 points 0 points  Total Score 0 points 0 points 0 points    Immunizations Immunization History  Administered Date(s) Administered   Fluad Quad(high Dose 65+) 06/03/2019, 07/13/2021, 05/01/2022   Moderna Sars-Covid-2 Vaccination 03/26/2020, 04/23/2020    TDAP status: Due, Education has been  provided regarding the importance of this vaccine. Advised may receive this vaccine at local pharmacy or Health Dept. Aware to provide a copy of the vaccination record if obtained from local pharmacy or Health Dept. Verbalized acceptance and understanding.  Flu Vaccine status: Declined, Education has been provided regarding the importance of this vaccine but patient still declined. Advised may receive this vaccine at local pharmacy or Health Dept. Aware to provide a copy of the vaccination record if obtained from local pharmacy or Health Dept. Verbalized acceptance and understanding.  Pneumococcal vaccine status: Up to date  Covid-19 vaccine status: Declined, Education has been provided regarding the importance of this vaccine but patient still declined. Advised may receive this vaccine at local pharmacy or Health Dept.or vaccine clinic. Aware to provide a copy of the vaccination record if obtained from local pharmacy or Health Dept. Verbalized acceptance and understanding.  Qualifies for Shingles Vaccine? Yes   Zostavax completed No   Shingrix Completed?: No.    Education has been provided regarding the importance of this vaccine. Patient has been advised to call insurance company to determine out of pocket expense if they have not yet received this vaccine. Advised may also receive vaccine at local pharmacy or Health Dept. Verbalized acceptance and understanding.  Screening Tests Health Maintenance  Topic Date Due   COVID-19 Vaccine (3 - 2023-24 season) 04/04/2023 (Originally 04/26/2022)   Zoster Vaccines- Shingrix (1 of 2) 04/29/2023 (Originally 12/14/1998)   Pneumonia Vaccine 40+ Years old (1 of 1 - PCV) 03/18/2024 (Originally 12/13/2013)   MAMMOGRAM  03/18/2024 (Originally 03/22/2021)   Hepatitis C Screening  03/18/2024 (Originally 12/14/1968)   INFLUENZA VACCINE  03/27/2023   Medicare Annual Wellness (AWV)  03/18/2024   Colonoscopy  06/23/2029   DEXA SCAN  Completed   HPV VACCINES  Aged Out    DTaP/Tdap/Td  Discontinued    Health Maintenance  There are no preventive care reminders to display for this patient.   Colorectal cancer screening: Type of screening: Colonoscopy. Completed 06/24/2019. Repeat every 10 years  Mammogram status: Ordered pt will get breast exam at next visit- will go from there/ hesitant. Pt provided with contact info and advised to call to schedule appt.   Bone Density status: Completed 10/06/17. Results reflect: Bone density results: OSTEOPENIA. Repeat every 2 years.  Lung Cancer Screening: (Low Dose CT Chest recommended if Age 56-80 years, 20 pack-year currently smoking OR have quit w/in 15years.) does not qualify.    Additional Screening:  Hepatitis C Screening: does qualify; Completed pt does not want  Vision Screening: Recommended annual ophthalmology exams for early detection of glaucoma and other disorders of the eye. Is the patient up to date with their annual eye exam?  No  If pt is not established with a provider, would they like to be referred to a provider to establish care? No .   Dental Screening: Recommended annual dental exams for proper oral hygiene- yes Dr B  Diabetic Foot Exam:  Community Resource Referral / Chronic Care Management: CRR required this visit?  No   CCM required this visit?  No     Plan:  I have personally reviewed and noted the following in the patient's chart:   Medical and social history Use of alcohol, tobacco or illicit drugs  Current medications and supplements including opioid prescriptions. Patient is not currently taking opioid prescriptions. Functional ability and status Nutritional status Physical activity Advanced directives List of other physicians Hospitalizations, surgeries, and ER visits in previous 12 months Vitals Screenings to include cognitive, depression, and falls Referrals and appointments  In addition, I have reviewed and discussed with patient certain preventive  protocols, quality metrics, and best practice recommendations. A written personalized care plan for preventive services as well as general preventive health recommendations were provided to patient.     Everitt Amber   03/19/2023   After Visit Summary: (MyChart) Due to this being a telephonic visit, the after visit summary with patients personalized plan was offered to patient via MyChart   Nurse Notes: needs breast exam at next appt- hesitant to get mammo

## 2023-05-13 DIAGNOSIS — Z961 Presence of intraocular lens: Secondary | ICD-10-CM | POA: Diagnosis not present

## 2023-05-15 ENCOUNTER — Other Ambulatory Visit: Payer: Self-pay | Admitting: Internal Medicine

## 2023-05-15 DIAGNOSIS — R399 Unspecified symptoms and signs involving the genitourinary system: Secondary | ICD-10-CM

## 2023-06-02 DIAGNOSIS — H04123 Dry eye syndrome of bilateral lacrimal glands: Secondary | ICD-10-CM | POA: Diagnosis not present

## 2023-06-02 DIAGNOSIS — H353111 Nonexudative age-related macular degeneration, right eye, early dry stage: Secondary | ICD-10-CM | POA: Diagnosis not present

## 2023-06-02 DIAGNOSIS — H40003 Preglaucoma, unspecified, bilateral: Secondary | ICD-10-CM | POA: Diagnosis not present

## 2023-06-02 DIAGNOSIS — H353121 Nonexudative age-related macular degeneration, left eye, early dry stage: Secondary | ICD-10-CM | POA: Diagnosis not present

## 2023-07-15 ENCOUNTER — Ambulatory Visit: Payer: Medicare Other | Admitting: Family Medicine

## 2023-08-04 ENCOUNTER — Encounter: Payer: Self-pay | Admitting: Family Medicine

## 2023-08-04 ENCOUNTER — Ambulatory Visit (INDEPENDENT_AMBULATORY_CARE_PROVIDER_SITE_OTHER): Payer: Medicare Other | Admitting: Family Medicine

## 2023-08-04 VITALS — BP 102/84 | HR 86 | Ht 59.0 in | Wt 124.0 lb

## 2023-08-04 DIAGNOSIS — K2100 Gastro-esophageal reflux disease with esophagitis, without bleeding: Secondary | ICD-10-CM | POA: Diagnosis not present

## 2023-08-04 DIAGNOSIS — F419 Anxiety disorder, unspecified: Secondary | ICD-10-CM

## 2023-08-04 DIAGNOSIS — F5101 Primary insomnia: Secondary | ICD-10-CM | POA: Diagnosis not present

## 2023-08-04 DIAGNOSIS — F32A Depression, unspecified: Secondary | ICD-10-CM

## 2023-08-04 DIAGNOSIS — E782 Mixed hyperlipidemia: Secondary | ICD-10-CM

## 2023-08-04 MED ORDER — EZETIMIBE 10 MG PO TABS: 10.0000 mg | ORAL_TABLET | Freq: Every day | ORAL | 1 refills | Status: DC

## 2023-08-04 MED ORDER — BUSPIRONE HCL 7.5 MG PO TABS: 7.5000 mg | ORAL_TABLET | Freq: Two times a day (BID) | ORAL | 1 refills | Status: DC

## 2023-08-04 MED ORDER — AMITRIPTYLINE HCL 50 MG PO TABS: 50.0000 mg | ORAL_TABLET | Freq: Every day | ORAL | 1 refills | Status: DC

## 2023-08-04 MED ORDER — PANTOPRAZOLE SODIUM 40 MG PO TBEC: 40.0000 mg | DELAYED_RELEASE_TABLET | Freq: Every day | ORAL | 1 refills | Status: DC

## 2023-08-04 MED ORDER — CITALOPRAM HYDROBROMIDE 20 MG PO TABS: 20.0000 mg | ORAL_TABLET | Freq: Every day | ORAL | 1 refills | Status: DC

## 2023-08-04 NOTE — Progress Notes (Signed)
Date:  08/04/2023   Name:  Michelle Willis   DOB:  27-Jan-1951   MRN:  [id number]   Chief Complaint: Depression, Gastroesophageal Reflux (Pantoprazole does not help ), Hyperlipidemia, Anxiety, and Insomnia  Depression        This is a chronic problem.  The current episode started more than 1 year ago.   The onset quality is gradual.   The problem has been gradually improving since onset.  Associated symptoms include no decreased concentration, no fatigue, no helplessness, no hopelessness, does not have insomnia, not irritable, no restlessness, no decreased interest, no appetite change, no body aches, no myalgias, no headaches, no indigestion, not sad and no suicidal ideas.     The symptoms are aggravated by nothing.  Past treatments include SSRIs - Selective serotonin reuptake inhibitors.  Compliance with treatment is variable.  Previous treatment provided moderate relief.  Past medical history includes anxiety.   Gastroesophageal Reflux She complains of heartburn. She reports no abdominal pain, no chest pain, no choking, no coughing, no dysphagia or no wheezing. This is a chronic problem. The current episode started more than 1 year ago. The problem has been gradually improving. The symptoms are aggravated by certain foods (pork). Pertinent negatives include no fatigue. She has tried a PPI for the symptoms. The treatment provided moderate relief.  Hyperlipidemia This is a chronic problem. The current episode started more than 1 year ago. The problem is controlled. Recent lipid tests were reviewed and are normal. She has no history of chronic renal disease or diabetes. Pertinent negatives include no chest pain, focal sensory loss, focal weakness, leg pain, myalgias or shortness of breath. Current antihyperlipidemic treatment includes ezetimibe. The current treatment provides moderate improvement of lipids. There are no compliance problems.  Risk factors for coronary artery disease include dyslipidemia.   Anxiety Presents for follow-up visit. Patient reports no chest pain, compulsions, decreased concentration, depressed mood, dizziness, excessive worry, feeling of choking, hyperventilation, impotence, insomnia, irritability, malaise, muscle tension, nervous/anxious behavior, obsessions, palpitations, restlessness, shortness of breath or suicidal ideas. Symptoms occur occasionally.    Insomnia Primary symptoms: no difficulty falling asleep.   The current episode started more than one year. The problem has been rapidly improving since onset. The symptoms are aggravated by SSRI use. PMH includes: depression.   Hypertension This is a chronic problem. The current episode started more than 1 year ago. The problem has been gradually improving since onset. Associated symptoms include anxiety. Pertinent negatives include no chest pain, headaches, orthopnea, palpitations, PND or shortness of breath. Past treatments include lifestyle changes (low sodium diet). The current treatment provides moderate improvement. There are no compliance problems.  There is no history of chronic renal disease.    Lab Results  Component Value Date   NA 141 02/24/2023   K 4.6 02/24/2023   CO2 23 02/24/2023   GLUCOSE 117 (H) 02/24/2023   BUN 17 02/24/2023   CREATININE 0.88 02/24/2023   CALCIUM 9.2 02/24/2023   EGFR 69 02/24/2023   GFRNONAA 73 08/31/2020   Lab Results  Component Value Date   CHOL 190 02/24/2023   HDL 67 02/24/2023   LDLCALC 91 02/24/2023   TRIG 189 (H) 02/24/2023   No results found for: "TSH" No results found for: "HGBA1C" Lab Results  Component Value Date   WBC 5.9 01/27/2023   HGB 13.5 01/27/2023   HCT 41.0 01/27/2023   MCV 86 01/27/2023   PLT 266 01/27/2023   Lab Results  Component Value  Date   ALT 11 02/24/2023   AST 13 02/24/2023   ALKPHOS 72 02/24/2023   BILITOT 0.3 02/24/2023   No results found for: "25OHVITD2", "25OHVITD3", "VD25OH"   Review of Systems  Constitutional:   Negative for appetite change, fatigue and irritability.  Respiratory:  Negative for cough, choking, chest tightness, shortness of breath and wheezing.   Cardiovascular:  Negative for chest pain, palpitations, orthopnea and PND.  Gastrointestinal:  Positive for heartburn. Negative for abdominal distention, abdominal pain, blood in stool and dysphagia.  Endocrine: Negative for polydipsia and polyuria.  Genitourinary:  Negative for impotence and vaginal bleeding.  Musculoskeletal:  Negative for myalgias.  Neurological:  Negative for dizziness, focal weakness and headaches.  Psychiatric/Behavioral:  Positive for depression. Negative for decreased concentration and suicidal ideas. The patient is not nervous/anxious and does not have insomnia.     Patient Active Problem List   Diagnosis Date Noted   Gastroesophageal reflux disease with esophagitis without hemorrhage 09/06/2019   Problems with swallowing and mastication    Stricture and stenosis of esophagus    Encounter for screening colonoscopy    Primary insomnia 03/13/2017   Essential hypertension 09/30/2016   Anxiety and depression 09/30/2016   Major depressive disorder, recurrent episode, moderate (HCC) 03/28/2015    No Known Allergies  Past Surgical History:  Procedure Laterality Date   ABDOMINAL HYSTERECTOMY     CHOLECYSTECTOMY     COLONOSCOPY WITH PROPOFOL N/A 06/24/2019   Procedure: COLONOSCOPY WITH PROPOFOL;  Surgeon: Midge Minium, MD;  Location: [facility];  Service: Endoscopy;  Laterality: N/A;   ESOPHAGOGASTRODUODENOSCOPY (EGD) WITH PROPOFOL N/A 07/16/2019   Procedure: ESOPHAGOGASTRODUODENOSCOPY (EGD) WITH DILATION;  Surgeon: Midge Minium, MD;  Location: [facility];  Service: Endoscopy;  Laterality: N/A;   VAGINAL HYSTERECTOMY      Social History   Tobacco Use   Smoking status: Never   Smokeless tobacco: Never  Vaping Use   Vaping status: Never Used  Substance Use Topics   Alcohol use: No     Alcohol/week: 0.0 standard drinks of alcohol   Drug use: No     Medication list has been reviewed and updated.  Current Meds  Medication Sig   amitriptyline (ELAVIL) 50 MG tablet Take 1 tablet (50 mg total) by mouth at bedtime.   Ascorbic Acid (VITAMIN C ADULT GUMMIES PO)    busPIRone (BUSPAR) 7.5 MG tablet Take 1 tablet (7.5 mg total) by mouth 2 (two) times daily.   Calcium Carbonate (CALCIUM 600 PO) Take 1 tablet by mouth daily.   Cholecalciferol (VITAMIN D3 ADULT GUMMIES PO) Take 1 tablet by mouth daily.    citalopram (CELEXA) 20 MG tablet Take 1 tablet (20 mg total) by mouth daily.   ezetimibe (ZETIA) 10 MG tablet Take 1 tablet (10 mg total) by mouth daily.   pantoprazole (PROTONIX) 40 MG tablet Take 1 tablet (40 mg total) by mouth daily.       08/04/2023    2:34 PM 02/21/2023    9:04 AM 01/27/2023    9:12 AM 09/12/2022    9:17 AM  GAD 7 : Generalized Anxiety Score  Nervous, Anxious, on Edge 0 0 0 0  Control/stop worrying 0 0 0 0  Worry too much - different things 0 0 0 0  Trouble relaxing 0 0 0 0  Restless 0 0 0 0  Easily annoyed or irritable 0 0 0 0  Afraid - awful might happen 0 0 0 0  Total GAD 7 Score 0  0 0 0  Anxiety Difficulty Not difficult at all Not difficult at all Not difficult at all Not difficult at all       08/04/2023    2:33 PM 03/19/2023    9:43 AM 02/21/2023    9:04 AM  Depression screen PHQ 2/9  Decreased Interest 0 0 0  Down, Depressed, Hopeless 0 0 0  PHQ - 2 Score 0 0 0  Altered sleeping 0 0 0  Tired, decreased energy 0 0 0  Change in appetite 0 0 0  Feeling bad or failure about yourself  0 0 0  Trouble concentrating 0 0 0  Moving slowly or fidgety/restless 0 0 0  Suicidal thoughts 0 0 0  PHQ-9 Score 0 0 0  Difficult doing work/chores Not difficult at all Not difficult at all Not difficult at all    BP Readings from Last 3 Encounters:  08/04/23 102/84  02/21/23 104/70  01/27/23 120/76    Physical Exam Vitals and nursing note reviewed.  Exam conducted with a chaperone present.  Constitutional:      General: She is not irritable.She is not in acute distress.    Appearance: She is not diaphoretic.  HENT:     Head: Normocephalic and atraumatic.     Right Ear: Tympanic membrane and external ear normal.     Left Ear: Tympanic membrane and external ear normal.     Nose: Nose normal.     Mouth/Throat:     Mouth: Mucous membranes are moist.  Eyes:     General:        Right eye: No discharge.        Left eye: No discharge.     Conjunctiva/sclera: Conjunctivae normal.     Pupils: Pupils are equal, round, and reactive to light.  Neck:     Thyroid: No thyromegaly.     Vascular: No JVD.  Cardiovascular:     Rate and Rhythm: Normal rate and regular rhythm.     Heart sounds: Normal heart sounds. No murmur heard.    No friction rub. No gallop.  Pulmonary:     Effort: Pulmonary effort is normal.     Breath sounds: Normal breath sounds. No stridor. No wheezing, rhonchi or rales.  Abdominal:     General: Bowel sounds are normal.     Palpations: Abdomen is soft. There is no mass.     Tenderness: There is no abdominal tenderness. There is no guarding.  Musculoskeletal:        General: Normal range of motion.     Cervical back: Normal range of motion and neck supple.  Lymphadenopathy:     Cervical: No cervical adenopathy.  Skin:    General: Skin is warm and dry.  Neurological:     Mental Status: She is alert.     Deep Tendon Reflexes: Reflexes are normal and symmetric.     Wt Readings from Last 3 Encounters:  08/04/23 124 lb (56.2 kg)  03/19/23 120 lb (54.4 kg)  02/21/23 120 lb (54.4 kg)    BP 102/84   Pulse 86   Ht 4\' 11"  (1.499 m)   Wt 124 lb (56.2 kg)   SpO2 99%   BMI 25.04 kg/m   Assessment and Plan:  1. Gastroesophageal reflux disease with esophagitis without hemorrhage Chronic.  Controlled.  Stable.  Asymptomatic on current dosing of pantoprazole 40 mg once a day.  Will continue at current dose and  recheck patient in 6 months. - pantoprazole (  PROTONIX) 40 MG tablet; Take 1 tablet (40 mg total) by mouth daily.  Dispense: 90 tablet; Refill: 1  2. Mixed hyperlipidemia Chronic.  Controlled.  Stable.  Asymptomatic without myalgias or muscle weakness on current dosing of Zetia 10 mg once a day.  Review of previous lipid on 02/24/2023 is acceptable and we will hold and evaluate lipid panel at next visit. - ezetimibe (ZETIA) 10 MG tablet; Take 1 tablet (10 mg total) by mouth daily.  Dispense: 90 tablet; Refill: 1  3. Anxiety and depression Chronic.  Controlled.  Stable.  PHQ is 0.  GAD score is 0.  Continue dosing of citalopram 20 mg once a day and buspirone 7.5 mg twice a day for anxiety.  Patient will continue with current dosings and we will recheck in 6 months. - citalopram (CELEXA) 20 MG tablet; Take 1 tablet (20 mg total) by mouth daily.  Dispense: 90 tablet; Refill: 1 - busPIRone (BUSPAR) 7.5 MG tablet; Take 1 tablet (7.5 mg total) by mouth 2 (two) times daily.  Dispense: 180 tablet; Refill: 1  4. Primary insomnia Chronic.  Controlled.  Stable.  Symptomatic relief of insomnia is currently controlled on amitriptyline 50 mg nightly.  Will recheck patient in 6 months for reevaluation.  Patient denies excessive daytime somnolence and is not having any issues with falls currently. - amitriptyline (ELAVIL) 50 MG tablet; Take 1 tablet (50 mg total) by mouth at bedtime.  Dispense: 90 tablet; Refill: 1    Elizabeth Sauer, MD

## 2024-01-22 ENCOUNTER — Ambulatory Visit (INDEPENDENT_AMBULATORY_CARE_PROVIDER_SITE_OTHER): Payer: Self-pay | Admitting: Family Medicine

## 2024-01-22 ENCOUNTER — Encounter: Payer: Self-pay | Admitting: Family Medicine

## 2024-01-22 VITALS — BP 136/88 | HR 92 | Ht 59.0 in

## 2024-01-22 DIAGNOSIS — E782 Mixed hyperlipidemia: Secondary | ICD-10-CM

## 2024-01-22 DIAGNOSIS — F419 Anxiety disorder, unspecified: Secondary | ICD-10-CM

## 2024-01-22 DIAGNOSIS — K2100 Gastro-esophageal reflux disease with esophagitis, without bleeding: Secondary | ICD-10-CM | POA: Diagnosis not present

## 2024-01-22 DIAGNOSIS — F5101 Primary insomnia: Secondary | ICD-10-CM | POA: Diagnosis not present

## 2024-01-22 DIAGNOSIS — F32A Depression, unspecified: Secondary | ICD-10-CM

## 2024-01-22 MED ORDER — PANTOPRAZOLE SODIUM 40 MG PO TBEC: 40.0000 mg | DELAYED_RELEASE_TABLET | Freq: Every day | ORAL | 1 refills | Status: DC

## 2024-01-22 MED ORDER — CITALOPRAM HYDROBROMIDE 20 MG PO TABS: 20.0000 mg | ORAL_TABLET | Freq: Every day | ORAL | 1 refills | Status: DC

## 2024-01-22 MED ORDER — AMITRIPTYLINE HCL 50 MG PO TABS: 50.0000 mg | ORAL_TABLET | Freq: Every day | ORAL | 1 refills | Status: DC

## 2024-01-22 MED ORDER — BUSPIRONE HCL 7.5 MG PO TABS: 7.5000 mg | ORAL_TABLET | Freq: Two times a day (BID) | ORAL | 1 refills | Status: AC

## 2024-01-22 MED ORDER — EZETIMIBE 10 MG PO TABS: 10.0000 mg | ORAL_TABLET | Freq: Every day | ORAL | 1 refills | Status: DC

## 2024-01-22 NOTE — Progress Notes (Signed)
 Date:  01/22/2024   Name:  Michelle Willis   DOB:  [birth year]   MRN:  [id number]   Chief Complaint: Medication Refill (Patient presents today for a refill on her medications. She is doing well today and does not have any concerns.)  Anxiety Presents for follow-up visit. Patient reports no chest pain, compulsions, confusion, decreased concentration, depressed mood, dizziness, dry mouth, excessive worry, feeling of choking, hyperventilation, impotence, insomnia, irritability, malaise, muscle tension, nausea, nervous/anxious behavior, obsessions, palpitations, panic, restlessness, shortness of breath or suicidal ideas. The severity of symptoms is moderate.    Depression        This is a chronic problem.  The current episode started more than 1 year ago.   The onset quality is gradual.   The problem has been gradually improving since onset.  Associated symptoms include no decreased concentration, no fatigue, no helplessness, no hopelessness, does not have insomnia, not irritable, no restlessness, no appetite change, no myalgias, no indigestion, not sad and no suicidal ideas.  Past treatments include SSRIs - Selective serotonin reuptake inhibitors (citalopram ).  Compliance with treatment is good.  Past compliance problems include difficulty understanding directions.  Previous treatment provided moderate relief.  Past medical history includes anxiety.   Gastroesophageal Reflux She reports no abdominal pain, no chest pain, no coughing, no dysphagia, no heartburn, no nausea or no wheezing. This is a chronic problem. The problem has been gradually improving. The symptoms are aggravated by certain foods. Pertinent negatives include no fatigue. She has tried a PPI for the symptoms. The treatment provided moderate relief.  Hyperlipidemia This is a chronic problem. The current episode started more than 1 year ago. The problem is controlled. Recent lipid tests were reviewed and are normal. Pertinent negatives  include no chest pain, myalgias or shortness of breath. Current antihyperlipidemic treatment includes statins.  Insomnia Primary symptoms: no fragmented sleep, no difficulty falling asleep.   The current episode started more than one year. The onset quality is gradual. The problem has been gradually improving since onset. The symptoms are relieved by medication (amitryptyline). Past treatments include medication. The treatment provided moderate relief. PMH includes: depression.     Lab Results  Component Value Date   NA 141 02/24/2023   K 4.6 02/24/2023   CO2 23 02/24/2023   GLUCOSE 117 (H) 02/24/2023   BUN 17 02/24/2023   CREATININE 0.88 02/24/2023   CALCIUM 9.2 02/24/2023   EGFR 69 02/24/2023   GFRNONAA 73 08/31/2020   Lab Results  Component Value Date   CHOL 190 02/24/2023   HDL 67 02/24/2023   LDLCALC 91 02/24/2023   TRIG 189 (H) 02/24/2023   No results found for: "TSH" No results found for: "HGBA1C" Lab Results  Component Value Date   WBC 5.9 01/27/2023   HGB 13.5 01/27/2023   HCT 41.0 01/27/2023   MCV 86 01/27/2023   PLT 266 01/27/2023   Lab Results  Component Value Date   ALT 11 02/24/2023   AST 13 02/24/2023   ALKPHOS 72 02/24/2023   BILITOT 0.3 02/24/2023   No results found for: "25OHVITD2", "25OHVITD3", "VD25OH"   Review of Systems  Constitutional:  Negative for appetite change, fatigue and irritability.  Respiratory:  Negative for cough, chest tightness, shortness of breath and wheezing.   Cardiovascular:  Negative for chest pain and palpitations.  Gastrointestinal:  Negative for abdominal pain, blood in stool, dysphagia, heartburn and nausea.  Genitourinary:  Negative for impotence.  Musculoskeletal:  Negative for myalgias.  Neurological:  Negative for dizziness.  Psychiatric/Behavioral:  Positive for depression. Negative for confusion, decreased concentration and suicidal ideas. The patient is not nervous/anxious and does not have insomnia.      Patient Active Problem List   Diagnosis Date Noted   Gastroesophageal reflux disease with esophagitis without hemorrhage 09/06/2019   Problems with swallowing and mastication    Stricture and stenosis of esophagus    Encounter for screening colonoscopy    Primary insomnia 03/13/2017   Essential hypertension 09/30/2016   Anxiety and depression 09/30/2016   Major depressive disorder, recurrent episode, moderate (HCC) 03/28/2015    No Known Allergies  Past Surgical History:  Procedure Laterality Date   ABDOMINAL HYSTERECTOMY     CHOLECYSTECTOMY     COLONOSCOPY WITH PROPOFOL  N/A 06/24/2019   Procedure: COLONOSCOPY WITH PROPOFOL ;  Surgeon: Marnee Sink, MD;  Location: [facility];  Service: Endoscopy;  Laterality: N/A;   ESOPHAGOGASTRODUODENOSCOPY (EGD) WITH PROPOFOL  N/A 07/16/2019   Procedure: ESOPHAGOGASTRODUODENOSCOPY (EGD) WITH DILATION;  Surgeon: Marnee Sink, MD;  Location: [facility];  Service: Endoscopy;  Laterality: N/A;   VAGINAL HYSTERECTOMY      Social History   Tobacco Use   Smoking status: Never   Smokeless tobacco: Never  Vaping Use   Vaping status: Never Used  Substance Use Topics   Alcohol use: No    Alcohol/week: 0.0 standard drinks of alcohol   Drug use: No     Medication list has been reviewed and updated.  Current Meds  Medication Sig   Ascorbic Acid (VITAMIN C ADULT GUMMIES PO)    Calcium Carbonate (CALCIUM 600 PO) Take 1 tablet by mouth daily.   Cholecalciferol (VITAMIN D3 ADULT GUMMIES PO) Take 1 tablet by mouth daily.    [DISCONTINUED] amitriptyline  (ELAVIL ) 50 MG tablet Take 1 tablet (50 mg total) by mouth at bedtime.   [DISCONTINUED] busPIRone  (BUSPAR ) 7.5 MG tablet Take 1 tablet (7.5 mg total) by mouth 2 (two) times daily.   [DISCONTINUED] citalopram  (CELEXA ) 20 MG tablet Take 1 tablet (20 mg total) by mouth daily.   [DISCONTINUED] pantoprazole  (PROTONIX ) 40 MG tablet Take 1 tablet (40 mg total) by mouth daily.        08/04/2023    2:34 PM 02/21/2023    9:04 AM 01/27/2023    9:12 AM 09/12/2022    9:17 AM  GAD 7 : Generalized Anxiety Score  Nervous, Anxious, on Edge 0 0 0 0  Control/stop worrying 0 0 0 0  Worry too much - different things 0 0 0 0  Trouble relaxing 0 0 0 0  Restless 0 0 0 0  Easily annoyed or irritable 0 0 0 0  Afraid - awful might happen 0 0 0 0  Total GAD 7 Score 0 0 0 0  Anxiety Difficulty Not difficult at all Not difficult at all Not difficult at all Not difficult at all       01/22/2024    9:34 AM 08/04/2023    2:33 PM 03/19/2023    9:43 AM  Depression screen PHQ 2/9  Decreased Interest 0 0 0  Down, Depressed, Hopeless 0 0 0  PHQ - 2 Score 0 0 0  Altered sleeping 0 0 0  Tired, decreased energy 0 0 0  Change in appetite 0 0 0  Feeling bad or failure about yourself  0 0 0  Trouble concentrating 0 0 0  Moving slowly or fidgety/restless 0 0 0  Suicidal thoughts 0 0 0  PHQ-9 Score  0 0 0  Difficult doing work/chores Not difficult at all Not difficult at all Not difficult at all    BP Readings from Last 3 Encounters:  01/22/24 136/88  08/04/23 102/84  02/21/23 104/70    Physical Exam Vitals and nursing note reviewed.  Constitutional:      General: She is not irritable.She is not in acute distress.    Appearance: She is not diaphoretic.  HENT:     Head: Normocephalic and atraumatic.     Right Ear: Tympanic membrane and external ear normal.     Left Ear: Tympanic membrane and external ear normal.     Nose: Nose normal.     Mouth/Throat:     Mouth: Mucous membranes are moist.  Eyes:     General:        Right eye: No discharge.        Left eye: No discharge.     Conjunctiva/sclera: Conjunctivae normal.     Pupils: Pupils are equal, round, and reactive to light.  Neck:     Thyroid: No thyromegaly.     Vascular: No JVD.  Cardiovascular:     Rate and Rhythm: Normal rate and regular rhythm.     Heart sounds: Normal heart sounds. No murmur heard.    No friction rub.  No gallop.  Pulmonary:     Effort: Pulmonary effort is normal.     Breath sounds: Normal breath sounds. No wheezing, rhonchi or rales.  Abdominal:     General: Bowel sounds are normal.     Palpations: Abdomen is soft. There is no mass.     Tenderness: There is no abdominal tenderness. There is no guarding.  Musculoskeletal:        General: Normal range of motion.     Cervical back: Normal range of motion and neck supple.  Lymphadenopathy:     Cervical: No cervical adenopathy.  Skin:    General: Skin is warm and dry.  Neurological:     Mental Status: She is alert.     Deep Tendon Reflexes: Reflexes are normal and symmetric.     Wt Readings from Last 3 Encounters:  08/04/23 124 lb (56.2 kg)  03/19/23 120 lb (54.4 kg)  02/21/23 120 lb (54.4 kg)    BP 136/88   Pulse 92   Ht 4\' 11"  (1.499 m)   SpO2 95%   BMI 25.04 kg/m   Assessment and Plan: 1. Primary insomnia Chronic.  Controlled.  Stable.  Patient's sleep pattern has stabilized on current dosing of amitriptyline  50 mg at bedtime.  Will continue with current dosing. - amitriptyline  (ELAVIL ) 50 MG tablet; Take 1 tablet (50 mg total) by mouth at bedtime.  Dispense: 90 tablet; Refill: 1  2. Anxiety and depression (Primary) Monic.  Controlled.  Stable.  Patient with history of anxiety that is controlled on citalopram  20 mg daily and buspirone  7.5 mg twice a day.  PHQ is is 0 GAD score is 0.  We will continue at this dosing and will recheck patient in 6 months. - busPIRone  (BUSPAR ) 7.5 MG tablet; Take 1 tablet (7.5 mg total) by mouth 2 (two) times daily.  Dispense: 180 tablet; Refill: 1 - citalopram  (CELEXA ) 20 MG tablet; Take 1 tablet (20 mg total) by mouth daily.  Dispense: 90 tablet; Refill: 1  3. Mixed hyperlipidemia Chronic.  Controlled.  Stable.  Asymptomatic without myalgias or muscle weakness.  Continue Zetia  10 mg once a day.  Will recheck patient in 6 months. -  ezetimibe  (ZETIA ) 10 MG tablet; Take 1 tablet (10 mg  total) by mouth daily.  Dispense: 90 tablet; Refill: 1  4. Gastroesophageal reflux disease with esophagitis without hemorrhage Chronic.  Controlled.  Stable.  Continue pantoprazole  40 mg once a day.  Will recheck patient in 6 months. - pantoprazole  (PROTONIX ) 40 MG tablet; Take 1 tablet (40 mg total) by mouth daily.  Dispense: 90 tablet; Refill: 1     Alayne Allis, MD

## 2024-01-22 NOTE — Patient Instructions (Signed)

## 2024-06-02 ENCOUNTER — Encounter

## 2024-06-09 ENCOUNTER — Encounter: Payer: Self-pay | Admitting: Student

## 2024-06-09 ENCOUNTER — Ambulatory Visit: Payer: Self-pay

## 2024-06-09 ENCOUNTER — Ambulatory Visit (INDEPENDENT_AMBULATORY_CARE_PROVIDER_SITE_OTHER): Admitting: Student

## 2024-06-09 VITALS — BP 120/88 | HR 98 | Ht 59.0 in | Wt 122.0 lb

## 2024-06-09 DIAGNOSIS — N8110 Cystocele, unspecified: Secondary | ICD-10-CM | POA: Diagnosis not present

## 2024-06-09 NOTE — Telephone Encounter (Signed)
 Noted  Pt has appt.  KP

## 2024-06-09 NOTE — Progress Notes (Signed)
 Established Patient Office Visit  Subjective   Patient ID: Michelle Willis, female    DOB: 05/24/1951  Age: 73 y.o. MRN: [id number]  Chief Complaint  Patient presents with   Bladder Prolapse    Patient states she feels like her bladder has prolapsed, no pain, no blood in urine     Michelle Willis is a 73 y.o. person with medical hx listed below presents today for bladder prolapse for the past 1-2 months. Husband noticed prolapse about a year ago, however she did not have significant symptoms at that time. In the last 1-2 she has noticed bladder protrudes out of the vagina which is worse when she is walking or upright. Notes associated difficulty with urination, she has urge but unable to pass urine about 2-3 times a day. Occasionally has urinary incontinence. Denies dysuria, change in bowel habits, vaginal pain/dryness, and bowel incontinence. Remote hx of partial hysterectomy.   Patient Active Problem List   Diagnosis Date Noted   Pelvic organ prolapse quantification stage 2 cystocele 06/09/2024   Gastroesophageal reflux disease with esophagitis without hemorrhage 09/06/2019   Problems with swallowing and mastication    Stricture and stenosis of esophagus    Encounter for screening colonoscopy    Primary insomnia 03/13/2017   Essential hypertension 09/30/2016   Anxiety and depression 09/30/2016   Major depressive disorder, recurrent episode, moderate (HCC) 03/28/2015      ROS Refer to HPI    Objective:     Outpatient Encounter Medications as of 06/09/2024  Medication Sig   amitriptyline  (ELAVIL ) 50 MG tablet Take 1 tablet (50 mg total) by mouth at bedtime.   Ascorbic Acid (VITAMIN C ADULT GUMMIES PO)    busPIRone  (BUSPAR ) 7.5 MG tablet Take 1 tablet (7.5 mg total) by mouth 2 (two) times daily.   Calcium Carbonate (CALCIUM 600 PO) Take 1 tablet by mouth daily.   Cholecalciferol (VITAMIN D3 ADULT GUMMIES PO) Take 1 tablet by mouth daily.    citalopram  (CELEXA ) 20 MG tablet  Take 1 tablet (20 mg total) by mouth daily.   ezetimibe  (ZETIA ) 10 MG tablet Take 1 tablet (10 mg total) by mouth daily.   pantoprazole  (PROTONIX ) 40 MG tablet Take 1 tablet (40 mg total) by mouth daily.   No facility-administered encounter medications on file as of 06/09/2024.    BP 120/88   Pulse 98   Ht 4' 11 (1.499 m)   Wt 122 lb (55.3 kg)   SpO2 98%   BMI 24.64 kg/m  BP Readings from Last 3 Encounters:  06/09/24 120/88  01/22/24 136/88  08/04/23 102/84    Physical Exam Constitutional:      Appearance: Normal appearance.  Cardiovascular:     Rate and Rhythm: Normal rate and regular rhythm.  Pulmonary:     Effort: Pulmonary effort is normal.     Breath sounds: No rhonchi or rales.  Abdominal:     General: Abdomen is flat. Bowel sounds are normal. There is no distension.     Palpations: Abdomen is soft.     Tenderness: There is no abdominal tenderness.  Genitourinary:    General: Normal vulva.     Exam position: Lithotomy position.     Labia:        Right: No rash.        Left: No rash.      Comments: Stage II prolapse  Musculoskeletal:        General: Normal range of motion.  Right lower leg: No edema.     Left lower leg: No edema.  Skin:    General: Skin is warm and dry.     Capillary Refill: Capillary refill takes less than 2 seconds.  Neurological:     General: No focal deficit present.     Mental Status: She is alert and oriented to person, place, and time.  Psychiatric:        Mood and Affect: Mood normal.        Behavior: Behavior normal.        06/09/2024   11:25 AM 01/22/2024    9:34 AM 08/04/2023    2:33 PM  Depression screen PHQ 2/9  Decreased Interest 0 0 0  Down, Depressed, Hopeless 0 0 0  PHQ - 2 Score 0 0 0  Altered sleeping 1 0 0  Tired, decreased energy 0 0 0  Change in appetite 0 0 0  Feeling bad or failure about yourself  0 0 0  Trouble concentrating 0 0 0  Moving slowly or fidgety/restless 0 0 0  Suicidal thoughts 0 0 0   PHQ-9 Score 1 0 0  Difficult doing work/chores Not difficult at all Not difficult at all Not difficult at all       06/09/2024   11:25 AM 08/04/2023    2:34 PM 02/21/2023    9:04 AM 01/27/2023    9:12 AM  GAD 7 : Generalized Anxiety Score  Nervous, Anxious, on Edge 0 0 0 0  Control/stop worrying 0 0 0 0  Worry too much - different things 0 0 0 0  Trouble relaxing 0 0 0 0  Restless 0 0 0 0  Easily annoyed or irritable 0 0 0 0  Afraid - awful might happen 0 0 0 0  Total GAD 7 Score 0 0 0 0  Anxiety Difficulty Not difficult at all Not difficult at all Not difficult at all Not difficult at all    No results found for any visits on 06/09/24.    The 10-year ASCVD risk score (Arnett DK, et al., 2019) is: 14.3%    Assessment & Plan:  Pelvic organ prolapse quantification stage 2 cystocele Assessment & Plan: Stage II cystocele on exam, discussed conservative vs operative management, she would like to pursue surgical management. Referral made to Urology.   Orders: -     Ambulatory referral to Urology     Return if symptoms worsen or fail to improve.    Harlene Saddler, MD

## 2024-06-09 NOTE — Telephone Encounter (Signed)
 FYI Only or Action Required?: FYI only for provider.  Patient was last seen in primary care on 01/22/2024 by Joshua Cathryne BROCKS, MD.  Called Nurse Triage reporting Bladder Prolapse.  Symptoms began 1 year ago.  Interventions attempted: Other: patient states she has tried to push the object back up but by the afternoon it does not go back in.  Symptoms are: feels like her bladder is falling out, urinary urgency and incontinence intermittent gradually worsening.  Triage Disposition: See HCP Within 4 Hours (Or PCP Triage)  Patient/caregiver understands and will follow disposition?: Yes            Copied from CRM #[id number]. Topic: Clinical - Red Word Triage >> Jun 09, 2024  8:37 AM Joesph NOVAK wrote: Red Word that prompted transfer to Nurse Triage: patient feels like her bladder is falling out Reason for Disposition  [1] Something is hanging out of the vagina AND [2] can't easily be pushed back inside  Answer Assessment - Initial Assessment Questions 1. SYMPTOM: What's the main symptom you're concerned about? (e.g., frequency, incontinence)     Feels like bladder is falling out. She states about a 1/2 inch to inch is coming out by the end of the day.  2. ONSET: When did the  prolapse  start?     X 1 year, worsening.  3. PAIN: Is there any pain? If Yes, ask: How bad is it? (Scale: 1-10; mild, moderate, severe)     No.  4. CAUSE: What do you think is causing the symptoms?     Prolapsed bladder.  5. OTHER SYMPTOMS: Do you have any other symptoms? (e.g., blood in urine, fever, flank pain, pain with urination)     Urinary incontinence intermittent/occasionally; urinary urgency. Denies blood in urine, painful urination, fever.  6. PREGNANCY: Is there any chance you are pregnant? When was your last menstrual period?     N/A.  Protocols used: Urinary Symptoms-A-AH, Vaginal Symptoms-A-AH

## 2024-06-09 NOTE — Assessment & Plan Note (Signed)
 Stage II cystocele on exam, discussed conservative vs operative management, she would like to pursue surgical management. Referral made to Urology.

## 2024-06-21 ENCOUNTER — Telehealth: Payer: Self-pay | Admitting: General Practice

## 2024-06-21 ENCOUNTER — Other Ambulatory Visit: Payer: Self-pay | Admitting: Student

## 2024-06-21 ENCOUNTER — Telehealth: Payer: Self-pay | Admitting: Student

## 2024-06-21 MED ORDER — DIPHENOXYLATE-ATROPINE 2.5-0.025 MG PO TABS: 1.0000 | ORAL_TABLET | Freq: Four times a day (QID) | ORAL | 1 refills | Status: DC | PRN

## 2024-06-21 NOTE — Telephone Encounter (Signed)
 Please review. Patient is scheduled with urology until December.

## 2024-06-21 NOTE — Telephone Encounter (Signed)
 Copied from CRM [id number]. Topic: Referral - Question >> Jun 21, 2024  8:50 AM Michelle Willis wrote: Reason for CRM: Patient said the bladder coming out of her vagina is getting worse.. The referred: Dr Levert isn't available til Dec 29th.. Needs someone sooner, willing to drive.  Please call her back to discuss

## 2024-06-21 NOTE — Telephone Encounter (Signed)
 Copied from CRM [id number]. Topic: Referral - Question >> Jun 21, 2024  8:50 AM Emylou G wrote: Reason for CRM: Patient said the bladder coming out of her vagina is getting worse.. The referred: Dr Levert isn't available til Dec 29th.. Needs someone sooner, willing to drive.  Please call her back to discuss

## 2024-06-24 ENCOUNTER — Telehealth: Payer: Self-pay | Admitting: Student

## 2024-06-24 NOTE — Telephone Encounter (Signed)
 Called patient to discuss telephone call, there was no answer to my phone call. Left voicemail message to return the call to the office or send a MyChart message directly.

## 2024-06-24 NOTE — Telephone Encounter (Signed)
 Please review

## 2024-06-24 NOTE — Telephone Encounter (Signed)
 Copied from CRM [id number]. Topic: Referral - Question >> Jun 24, 2024 11:49 AM Olam RAMAN wrote: Reason for CRM: Pt is calling again for 2 dr she saw on her insurance for INN dr a [place]. Dr Kathlen, [place]. Dr Clorinda, [place] if these doctor do surgery. CB [phone]

## 2024-06-24 NOTE — Telephone Encounter (Signed)
 Copied from CRM #[id number]. Topic: Referral - Question >> Jun 24, 2024  8:43 AM Antony RAMAN wrote: Reason for CRM: wanting to know if theres another doctor she can see for her vaginal prolapse... she was thinking she would see someone else on Monday in [place]. Please advise pt

## 2024-06-25 NOTE — Telephone Encounter (Unsigned)
 Copied from CRM [id number]. Topic: General - Other >> Jun 24, 2024  4:56 PM Sophia H wrote: Reason for CRM: Patient is returning a missed phone call to Michelle Willis, no one available at [facility] - please return phone call # [phone]

## 2024-06-25 NOTE — Telephone Encounter (Signed)
 Called patient to discuss telephone call, there was no answer to my phone call. Left voicemail message to return the call to the office

## 2024-07-12 ENCOUNTER — Ambulatory Visit: Admitting: Urology

## 2024-07-12 VITALS — BP 191/104 | HR 99 | Ht 59.0 in | Wt 118.0 lb

## 2024-07-12 DIAGNOSIS — N8110 Cystocele, unspecified: Secondary | ICD-10-CM

## 2024-07-12 NOTE — Progress Notes (Signed)
 07/12/2024 2:05 PM   Michelle Willis [birth year] [id number]  Referring provider: Lemon Raisin, MD [address] [place],  [place] [address]  No chief complaint on file.   HPI: I Was consulted to assess the patient's prolapse.  The longer she stands the more bulging she feels.  She has had a hysterectomy.  Symptoms worsening over 6 weeks and are sometimes bothersome  She sometimes leaks with urgency but does not wear a pad.  No bedwetting or stress incontinence  Flow was good.  Voids every 2 hours during the day and gets up twice at night  Gets 2 bladder infections a year  No history of kidney stones or bladder surgery.  No neurologic issues.  Bowel function normal   PMH: Past Medical History:  Diagnosis Date   Anxiety    Cancer (HCC) 1991   basal cell ca   Depression    GERD (gastroesophageal reflux disease)     Surgical History: Past Surgical History:  Procedure Laterality Date   ABDOMINAL HYSTERECTOMY     CHOLECYSTECTOMY     COLONOSCOPY WITH PROPOFOL  N/A 06/24/2019   Procedure: COLONOSCOPY WITH PROPOFOL ;  Surgeon: Michelle Carmine, MD;  Location: [facility];  Service: Endoscopy;  Laterality: N/A;   ESOPHAGOGASTRODUODENOSCOPY (EGD) WITH PROPOFOL  N/A 07/16/2019   Procedure: ESOPHAGOGASTRODUODENOSCOPY (EGD) WITH DILATION;  Surgeon: Michelle Carmine, MD;  Location: [facility];  Service: Endoscopy;  Laterality: N/A;   VAGINAL HYSTERECTOMY      Home Medications:  Allergies as of 07/12/2024   No Known Allergies      Medication List        Accurate as of July 12, 2024  2:05 PM. If you have any questions, ask your nurse or doctor.          amitriptyline  50 MG tablet Commonly known as: ELAVIL  Take 1 tablet (50 mg total) by mouth at bedtime.   busPIRone  7.5 MG tablet Commonly known as: BUSPAR  Take 1 tablet (7.5 mg total) by mouth 2 (two) times daily.   CALCIUM 600 PO Take 1 tablet by mouth daily.   citalopram  20 MG  tablet Commonly known as: CELEXA  Take 1 tablet (20 mg total) by mouth daily.   diphenoxylate-atropine 2.5-0.025 MG tablet Commonly known as: Lomotil Take 1 tablet by mouth 4 (four) times daily as needed for diarrhea or loose stools.   ezetimibe  10 MG tablet Commonly known as: Zetia  Take 1 tablet (10 mg total) by mouth daily.   pantoprazole  40 MG tablet Commonly known as: PROTONIX  Take 1 tablet (40 mg total) by mouth daily.   VITAMIN C ADULT GUMMIES PO   VITAMIN D3 ADULT GUMMIES PO Take 1 tablet by mouth daily.        Allergies: No Known Allergies  Family History: Family History  Problem Relation Age of Onset   Cancer Father    Breast cancer Neg Hx     Social History:  reports that she has never smoked. She has never used smokeless tobacco. She reports that she does not drink alcohol and does not use drugs.  ROS:                                        Physical Exam: There were no vitals taken for this visit.  Constitutional:  Alert and oriented, No acute distress. HEENT: [place] AT, moist mucus membranes.  Trachea midline, no masses.  Cardiovascular: No clubbing, cyanosis, or edema. Respiratory: Normal respiratory effort, no increased work of breathing. GI: Abdomen is soft, nontender, nondistended, no abdominal masses GU: On pelvic semination patient had a grade 3 cystocele with moderate defect.  Vaginal cuff descended from 8 or 9 cm to approximately 5 cm.  Minimal rectocele and no stress incontinence.  She is a petite lady Skin: No rashes, bruises or suspicious lesions. Lymph: No cervical or inguinal adenopathy. Neurologic: Grossly intact, no focal deficits, moving all 4 extremities. Psychiatric: Normal mood and affect.  Laboratory Data: Lab Results  Component Value Date   WBC 5.9 01/27/2023   HGB 13.5 01/27/2023   HCT 41.0 01/27/2023   MCV 86 01/27/2023   PLT 266 01/27/2023    Lab Results  Component Value Date   CREATININE 0.88  02/24/2023    No results found for: PSA  No results found for: TESTOSTERONE  No results found for: HGBA1C  Urinalysis    Component Value Date/Time   BILIRUBINUR negative 05/20/2022 1605   PROTEINUR Negative 05/20/2022 1605   UROBILINOGEN 0.2 05/20/2022 1605   NITRITE positive 05/20/2022 1605   LEUKOCYTESUR Moderate (2+) (A) 05/20/2022 1605    Pertinent Imaging: Urine reviewed.  Urine sent for culture.  Chart reviewed  Assessment & Plan: Picture drawn.  Patient has mild urge incontinence.  She has prolapse symptoms.  Role of urodynamics versus pessary versus surgery and SITE OF service and referral discussed.  She would like to proceed  1. Pelvic organ prolapse quantification stage 2 cystocele (Primary)  - Urinalysis, Complete   No follow-ups on file.  Michelle DELENA Elizabeth, MD  [facility] [address] [place], [place] [address] [phone]

## 2024-07-26 ENCOUNTER — Encounter: Payer: Self-pay | Admitting: Student

## 2024-07-26 ENCOUNTER — Ambulatory Visit (INDEPENDENT_AMBULATORY_CARE_PROVIDER_SITE_OTHER): Admitting: Student

## 2024-07-26 VITALS — BP 122/76 | HR 60 | Ht 59.0 in | Wt 123.0 lb

## 2024-07-26 DIAGNOSIS — K2100 Gastro-esophageal reflux disease with esophagitis, without bleeding: Secondary | ICD-10-CM | POA: Diagnosis not present

## 2024-07-26 DIAGNOSIS — I10 Essential (primary) hypertension: Secondary | ICD-10-CM | POA: Diagnosis not present

## 2024-07-26 DIAGNOSIS — F5101 Primary insomnia: Secondary | ICD-10-CM

## 2024-07-26 DIAGNOSIS — Z23 Encounter for immunization: Secondary | ICD-10-CM

## 2024-07-26 DIAGNOSIS — M810 Age-related osteoporosis without current pathological fracture: Secondary | ICD-10-CM | POA: Insufficient documentation

## 2024-07-26 DIAGNOSIS — F411 Generalized anxiety disorder: Secondary | ICD-10-CM

## 2024-07-26 DIAGNOSIS — F331 Major depressive disorder, recurrent, moderate: Secondary | ICD-10-CM | POA: Diagnosis not present

## 2024-07-26 DIAGNOSIS — E782 Mixed hyperlipidemia: Secondary | ICD-10-CM

## 2024-07-26 DIAGNOSIS — E785 Hyperlipidemia, unspecified: Secondary | ICD-10-CM | POA: Insufficient documentation

## 2024-07-26 MED ORDER — TRAZODONE HCL 50 MG PO TABS: 50.0000 mg | ORAL_TABLET | Freq: Every evening | ORAL | 3 refills | Status: DC | PRN

## 2024-07-26 MED ORDER — PANTOPRAZOLE SODIUM 40 MG PO TBEC: 40.0000 mg | DELAYED_RELEASE_TABLET | Freq: Every day | ORAL | 1 refills | Status: DC

## 2024-07-26 MED ORDER — AMITRIPTYLINE HCL 25 MG PO TABS: 25.0000 mg | ORAL_TABLET | Freq: Every day | ORAL | 0 refills | Status: DC

## 2024-07-26 NOTE — Assessment & Plan Note (Signed)
 Normotensive today. Diet controlled.

## 2024-07-26 NOTE — Assessment & Plan Note (Signed)
 Currently on buspirone  7.5 mg twice daily celexa  20 mg. Reports stressors related to family. Feels safe at home. No SI or HI. GAD is 1 today.

## 2024-07-26 NOTE — Assessment & Plan Note (Signed)
 Stable on pantoprazole 40 mg daily

## 2024-07-26 NOTE — Assessment & Plan Note (Signed)
 The 10-year ASCVD risk score (Arnett DK, et al., 2019) is: 14.7% las check in 2024. Appears she was on zetia  in the past but is not currently taking this. Will repeat lipid panel at next visit and discuss treatment based on results.

## 2024-07-26 NOTE — Progress Notes (Signed)
 Established Patient Office Visit  Subjective   Patient ID: Michelle Willis, female    DOB: 12-Oct-1950  Age: 73 y.o. MRN: [id number]  Chief Complaint  Patient presents with   Establish Care    Amitriptyline  - discuss alternative     Michelle Willis is a 73 y.o. person with medical hx listed below who presents today for transfer of care. Having more issues with sleep in the last few years and does not feel amitriptyline  is not helping.   Patient Active Problem List   Diagnosis Date Noted   HLD (hyperlipidemia) 07/26/2024   Osteoporosis 07/26/2024   Pelvic organ prolapse quantification stage 2 cystocele 06/09/2024   Gastroesophageal reflux disease with esophagitis without hemorrhage 09/06/2019   Encounter for screening colonoscopy    Primary insomnia 03/13/2017   Essential hypertension 09/30/2016   GAD (generalized anxiety disorder) 09/30/2016   Major depressive disorder, recurrent episode, moderate (HCC) 03/28/2015    ROS Refer to HPI    Objective:     Outpatient Encounter Medications as of 07/26/2024  Medication Sig   amitriptyline  (ELAVIL ) 25 MG tablet Take 1 tablet (25 mg total) by mouth at bedtime for 14 days. Discontinue after 2 weeks   Ascorbic Acid (VITAMIN C ADULT GUMMIES PO)    busPIRone  (BUSPAR ) 7.5 MG tablet Take 1 tablet (7.5 mg total) by mouth 2 (two) times daily.   Calcium Carbonate (CALCIUM 600 PO) Take 1 tablet by mouth daily.   Cholecalciferol (VITAMIN D3 ADULT GUMMIES PO) Take 1 tablet by mouth daily.    citalopram  (CELEXA ) 20 MG tablet Take 1 tablet (20 mg total) by mouth daily.   diphenoxylate -atropine  (LOMOTIL ) 2.5-0.025 MG tablet Take 1 tablet by mouth 4 (four) times daily as needed for diarrhea or loose stools.   ezetimibe  (ZETIA ) 10 MG tablet Take 1 tablet (10 mg total) by mouth daily.   traZODone (DESYREL) 50 MG tablet Take 1 tablet (50 mg total) by mouth at bedtime as needed for sleep.   [DISCONTINUED] amitriptyline  (ELAVIL ) 50 MG tablet Take 1  tablet (50 mg total) by mouth at bedtime.   [DISCONTINUED] pantoprazole  (PROTONIX ) 40 MG tablet Take 1 tablet (40 mg total) by mouth daily.   pantoprazole  (PROTONIX ) 40 MG tablet Take 1 tablet (40 mg total) by mouth daily.   No facility-administered encounter medications on file as of 07/26/2024.    BP 122/76   Pulse 60   Ht 4' 11 (1.499 m)   Wt 123 lb (55.8 kg)   SpO2 96%   BMI 24.84 kg/m  BP Readings from Last 3 Encounters:  07/26/24 122/76  07/12/24 (!) 191/104  06/09/24 120/88    Physical Exam Constitutional:      Appearance: Normal appearance.  HENT:     Mouth/Throat:     Mouth: Mucous membranes are moist.     Pharynx: Oropharynx is clear.  Cardiovascular:     Rate and Rhythm: Normal rate and regular rhythm.  Pulmonary:     Effort: Pulmonary effort is normal.     Breath sounds: No rhonchi or rales.  Abdominal:     General: Abdomen is flat. Bowel sounds are normal. There is no distension.     Palpations: Abdomen is soft.     Tenderness: There is no abdominal tenderness.  Musculoskeletal:        General: Normal range of motion.     Right lower leg: No edema.     Left lower leg: No edema.  Skin:    General:  Skin is warm and dry.     Capillary Refill: Capillary refill takes less than 2 seconds.  Neurological:     General: No focal deficit present.     Mental Status: She is alert and oriented to person, place, and time.  Psychiatric:        Mood and Affect: Mood normal.        Behavior: Behavior normal.        07/26/2024    9:46 AM 06/09/2024   11:25 AM 01/22/2024    9:34 AM  Depression screen PHQ 2/9  Decreased Interest 0 0 0  Down, Depressed, Hopeless 0 0 0  PHQ - 2 Score 0 0 0  Altered sleeping 2 1 0  Tired, decreased energy 2 0 0  Change in appetite 0 0 0  Feeling bad or failure about yourself  0 0 0  Trouble concentrating 2 0 0  Moving slowly or fidgety/restless 0 0 0  Suicidal thoughts 0 0 0  PHQ-9 Score 6 1  0   Difficult doing work/chores  Somewhat difficult Not difficult at all Not difficult at all     Data saved with a previous flowsheet row definition       07/26/2024    9:46 AM 06/09/2024   11:25 AM 08/04/2023    2:34 PM 02/21/2023    9:04 AM  GAD 7 : Generalized Anxiety Score  Nervous, Anxious, on Edge 0 0 0 0  Control/stop worrying 0 0 0 0  Worry too much - different things 0 0 0 0  Trouble relaxing 0 0 0 0  Restless 0 0 0 0  Easily annoyed or irritable 1 0 0 0  Afraid - awful might happen 0 0 0 0  Total GAD 7 Score 1 0 0 0  Anxiety Difficulty Somewhat difficult Not difficult at all Not difficult at all Not difficult at all    No results found for any visits on 07/26/24.    The 10-year ASCVD risk score (Arnett DK, et al., 2019) is: 14.7%    Assessment & Plan:  Major depressive disorder, recurrent episode, moderate (HCC) Assessment & Plan: Stable on celexa  20 mg daily. PHQ9 is 6 today. Has been on celexa  since around 2008. Continue current medication.    Gastroesophageal reflux disease with esophagitis without hemorrhage Assessment & Plan: Stable on pantoprazole  40 mg daily.   Orders: -     Pantoprazole  Sodium; Take 1 tablet (40 mg total) by mouth daily.  Dispense: 90 tablet; Refill: 1  Essential hypertension Assessment & Plan: Normotensive today. Diet controlled.    GAD (generalized anxiety disorder) Assessment & Plan: Currently on buspirone  7.5 mg twice daily celexa  20 mg. Reports stressors related to family. Feels safe at home. No SI or HI. GAD is 1 today.    Primary insomnia Assessment & Plan: Has difficulty falling asleep, has been on amitriptyline  50 mg nightly. Feels like it has not been working well in the last 3 years. Has tried valerian root and melatonin. Reports tossing a turning about 1 hour before falling asleep about 2 nights week. Plays cards for an hour before bedtime. Has not been on any other medications for sleep. Discussed sleep hygiene and avoiding screen time before bed.   -Decrease amitriptyline  to 25 mg at bedtime for 2 weeks then discontinue, restart if having significant discontinuation sx -Start trazodone 50 mg at bedtime as needed   Encounter for immunization -     Flu vaccine HIGH DOSE PF(Fluzone  Trivalent)  Mixed hyperlipidemia Assessment & Plan: The 10-year ASCVD risk score (Arnett DK, et al., 2019) is: 14.7% las check in 2024. Appears she was on zetia  in the past but is not currently taking this. Will repeat lipid panel at next visit and discuss treatment based on results.    Other orders -     traZODone  HCl; Take 1 tablet (50 mg total) by mouth at bedtime as needed for sleep.  Dispense: 30 tablet; Refill: 3 -     Amitriptyline  HCl; Take 1 tablet (25 mg total) by mouth at bedtime for 14 days. Discontinue after 2 weeks  Dispense: 14 tablet; Refill: 0     Return in about 4 months (around 11/24/2024) for physical, insomnia.    Harlene Saddler, MD

## 2024-07-26 NOTE — Assessment & Plan Note (Addendum)
 Has difficulty falling asleep, has been on amitriptyline  50 mg nightly. Feels like it has not been working well in the last 3 years. Has tried valerian root and melatonin. Reports tossing a turning about 1 hour before falling asleep about 2 nights week. Plays cards for an hour before bedtime. Has not been on any other medications for sleep. Discussed sleep hygiene and avoiding screen time before bed.  -Decrease amitriptyline  to 25 mg at bedtime for 2 weeks then discontinue, restart if having significant discontinuation sx -Start trazodone 50 mg at bedtime as needed

## 2024-07-26 NOTE — Assessment & Plan Note (Signed)
 Stable on celexa  20 mg daily. PHQ9 is 6 today. Has been on celexa  since around 2008. Continue current medication.

## 2024-08-12 ENCOUNTER — Telehealth: Payer: Self-pay

## 2024-08-12 NOTE — Telephone Encounter (Signed)
 Copied from CRM #[id number]. Topic: Clinical - Prescription Issue >> Aug 12, 2024  9:44 AM [name] wrote: Reason for CRM: The patient called in stating she saw her provider on December 1st and was put on traZODone  (DESYREL ) 50 MG tablet but she says it is not helping her sleep at all. She wants to know if she can be put back on her previous med or something else that will help her sleep. She uses  [id number] [place], [place] - [address]  Phone: [phone] Fax: [phone]  Please assist patient further

## 2024-08-12 NOTE — Telephone Encounter (Signed)
 Please call pt to schedule an appt to discuss.  KP

## 2024-08-13 ENCOUNTER — Encounter: Payer: Self-pay | Admitting: Student

## 2024-08-13 ENCOUNTER — Ambulatory Visit: Admitting: Student

## 2024-08-13 VITALS — BP 122/80 | HR 100 | Ht 59.0 in | Wt 122.0 lb

## 2024-08-13 DIAGNOSIS — K2100 Gastro-esophageal reflux disease with esophagitis, without bleeding: Secondary | ICD-10-CM | POA: Diagnosis not present

## 2024-08-13 DIAGNOSIS — F32A Depression, unspecified: Secondary | ICD-10-CM | POA: Diagnosis not present

## 2024-08-13 DIAGNOSIS — F5101 Primary insomnia: Secondary | ICD-10-CM | POA: Diagnosis not present

## 2024-08-13 DIAGNOSIS — F419 Anxiety disorder, unspecified: Secondary | ICD-10-CM

## 2024-08-13 MED ORDER — RAMELTEON 8 MG PO TABS: 8.0000 mg | ORAL_TABLET | Freq: Every day | ORAL | 3 refills | Status: DC

## 2024-08-13 MED ORDER — CITALOPRAM HYDROBROMIDE 20 MG PO TABS: 20.0000 mg | ORAL_TABLET | Freq: Every day | ORAL | 1 refills | Status: AC

## 2024-08-13 MED ORDER — PANTOPRAZOLE SODIUM 40 MG PO TBEC: 40.0000 mg | DELAYED_RELEASE_TABLET | Freq: Every day | ORAL | 1 refills | Status: AC

## 2024-08-13 NOTE — Assessment & Plan Note (Addendum)
 Has tried tylenol  PM, ASA, amitriptyline , zzzquil, and trazodone  for sleep. Does not feel amitriptyline  is working well. Having significant difficulty sleeping with trazodone  and nightmares with this. Will try ramelteon  for sleep. She has been working on sleep hygiene, goes to bed and wakes at consistent time.

## 2024-08-13 NOTE — Assessment & Plan Note (Signed)
Stable refilled medication

## 2024-08-13 NOTE — Progress Notes (Signed)
 "  Established Patient Office Visit  Subjective   Patient ID: Michelle Willis, female    DOB: February 08, 1951  Age: 73 y.o. MRN: [id number]  Chief Complaint  Patient presents with   Insomnia    Patient following up on trazodone . She would like to discuss a alternative medication because she is having difficulty falling asleep and staying asleep.    Michelle Willis is a 73 y.o. person with medical hx listed below who presents today for insomnia follow up. Was taking amitriptyline  until about 1 week ago. Then started trazodone . Feels this is nor helping, taking 2-3 hours to fall asleep. Also reports having a nightmare last night. Would like to try an alternative. Has tried amitriptyline   tylenol  PM, and Nyquil without improvement.   Patient Active Problem List   Diagnosis Date Noted   HLD (hyperlipidemia) 07/26/2024   Osteoporosis 07/26/2024   Pelvic organ prolapse quantification stage 2 cystocele 06/09/2024   Gastroesophageal reflux disease with esophagitis without hemorrhage 09/06/2019   Encounter for screening colonoscopy    Primary insomnia 03/13/2017   Essential hypertension 09/30/2016   GAD (generalized anxiety disorder) 09/30/2016   Major depressive disorder, recurrent episode, moderate (HCC) 03/28/2015      ROS Refer to HPI    Objective:     Outpatient Encounter Medications as of 08/13/2024  Medication Sig   Ascorbic Acid (VITAMIN C ADULT GUMMIES PO)    Calcium Carbonate (CALCIUM 600 PO) Take 1 tablet by mouth daily.   Cholecalciferol (VITAMIN D3 ADULT GUMMIES PO) Take 1 tablet by mouth daily.    ramelteon  (ROZEREM ) 8 MG tablet Take 1 tablet (8 mg total) by mouth at bedtime.   [DISCONTINUED] citalopram  (CELEXA ) 20 MG tablet Take 1 tablet (20 mg total) by mouth daily.   [DISCONTINUED] pantoprazole  (PROTONIX ) 40 MG tablet Take 1 tablet (40 mg total) by mouth daily.   [DISCONTINUED] traZODone  (DESYREL ) 50 MG tablet Take 1 tablet (50 mg total) by mouth at bedtime as needed for  sleep.   busPIRone  (BUSPAR ) 7.5 MG tablet Take 1 tablet (7.5 mg total) by mouth 2 (two) times daily.   citalopram  (CELEXA ) 20 MG tablet Take 1 tablet (20 mg total) by mouth daily.   pantoprazole  (PROTONIX ) 40 MG tablet Take 1 tablet (40 mg total) by mouth daily.   [DISCONTINUED] amitriptyline  (ELAVIL ) 25 MG tablet Take 1 tablet (25 mg total) by mouth at bedtime for 14 days. Discontinue after 2 weeks   [DISCONTINUED] diphenoxylate -atropine  (LOMOTIL ) 2.5-0.025 MG tablet Take 1 tablet by mouth 4 (four) times daily as needed for diarrhea or loose stools.   [DISCONTINUED] ezetimibe  (ZETIA ) 10 MG tablet Take 1 tablet (10 mg total) by mouth daily.   No facility-administered encounter medications on file as of 08/13/2024.    BP 122/80   Pulse 100   Ht 4' 11 (1.499 m)   Wt 122 lb (55.3 kg)   SpO2 98%   BMI 24.64 kg/m  BP Readings from Last 3 Encounters:  08/13/24 122/80  07/26/24 122/76  07/12/24 (!) 191/104    Physical Exam Constitutional:      Appearance: Normal appearance.  HENT:     Head: Normocephalic and atraumatic.  Eyes:     Extraocular Movements: Extraocular movements intact.  Cardiovascular:     Rate and Rhythm: Normal rate and regular rhythm.  Musculoskeletal:        General: Normal range of motion.  Skin:    General: Skin is warm and dry.  Neurological:  General: No focal deficit present.     Mental Status: She is alert. Mental status is at baseline.  Psychiatric:        Mood and Affect: Mood normal.        Behavior: Behavior normal.        08/13/2024    1:59 PM 07/26/2024    9:46 AM 06/09/2024   11:25 AM  Depression screen PHQ 2/9  Decreased Interest 0 0 0  Down, Depressed, Hopeless 0 0 0  PHQ - 2 Score 0 0 0  Altered sleeping  2 1  Tired, decreased energy  2 0  Change in appetite  0 0  Feeling bad or failure about yourself   0 0  Trouble concentrating  2 0  Moving slowly or fidgety/restless  0 0  Suicidal thoughts  0 0  PHQ-9 Score  6 1    Difficult doing work/chores  Somewhat difficult Not difficult at all     Data saved with a previous flowsheet row definition       07/26/2024    9:46 AM 06/09/2024   11:25 AM 08/04/2023    2:34 PM 02/21/2023    9:04 AM  GAD 7 : Generalized Anxiety Score  Nervous, Anxious, on Edge 0 0 0 0  Control/stop worrying 0 0 0 0  Worry too much - different things 0 0 0 0  Trouble relaxing 0 0 0 0  Restless 0 0 0 0  Easily annoyed or irritable 1 0 0 0  Afraid - awful might happen 0 0 0 0  Total GAD 7 Score 1 0 0 0  Anxiety Difficulty Somewhat difficult Not difficult at all Not difficult at all Not difficult at all    No results found for any visits on 08/13/24.  Last CBC Lab Results  Component Value Date   WBC 5.9 01/27/2023   HGB 13.5 01/27/2023   HCT 41.0 01/27/2023   MCV 86 01/27/2023   MCH 28.3 01/27/2023   RDW 13.9 01/27/2023   PLT 266 01/27/2023   Last metabolic panel Lab Results  Component Value Date   GLUCOSE 117 (H) 02/24/2023   NA 141 02/24/2023   K 4.6 02/24/2023   CL 106 02/24/2023   CO2 23 02/24/2023   BUN 17 02/24/2023   CREATININE 0.88 02/24/2023   EGFR 69 02/24/2023   CALCIUM 9.2 02/24/2023   PHOS 3.3 09/12/2022   PROT 6.1 02/24/2023   ALBUMIN 4.4 02/24/2023   LABGLOB 1.7 02/24/2023   AGRATIO 1.7 03/12/2022   BILITOT 0.3 02/24/2023   ALKPHOS 72 02/24/2023   AST 13 02/24/2023   ALT 11 02/24/2023   Last lipids Lab Results  Component Value Date   CHOL 190 02/24/2023   HDL 67 02/24/2023   LDLCALC 91 02/24/2023   TRIG 189 (H) 02/24/2023   Last hemoglobin A1c No results found for: HGBA1C    The 10-year ASCVD risk score (Arnett DK, et al., 2019) is: 14.7%    Assessment & Plan:  Primary insomnia Assessment & Plan: Has typnol PM, ASA, amitriptyline , zzzquil, and trazodone  for sleep. Does not feel amitriptyline  is working well. Having significant difficulty sleeping with trazodone  and nightmares with this. Will try ramelteon  for sleep. She has  been working on sleep hygiene, goes to bed and wakes at consistent time.    Anxiety and depression -     Citalopram  Hydrobromide; Take 1 tablet (20 mg total) by mouth daily.  Dispense: 90 tablet; Refill: 1  Gastroesophageal reflux  disease with esophagitis without hemorrhage Assessment & Plan: Stable refilled medication.  Orders: -     Pantoprazole  Sodium; Take 1 tablet (40 mg total) by mouth daily.  Dispense: 90 tablet; Refill: 1  Other orders -     Ramelteon ; Take 1 tablet (8 mg total) by mouth at bedtime.  Dispense: 30 tablet; Refill: 3     No follow-ups on file.    Harlene Saddler, MD "

## 2024-08-20 ENCOUNTER — Other Ambulatory Visit: Payer: Self-pay | Admitting: Student

## 2024-08-20 ENCOUNTER — Telehealth: Payer: Self-pay

## 2024-08-20 MED ORDER — SUVOREXANT 5 MG PO TABS: 5.0000 mg | ORAL_TABLET | Freq: Every evening | ORAL | 1 refills | Status: AC | PRN

## 2024-08-20 NOTE — Telephone Encounter (Signed)
 Copied from CRM [id number]. Topic: Clinical - Prescription Issue >> Aug 20, 2024  9:58 AM Charolett L wrote: Reason for CRM: patient called in and stated that the medication ramelteon  (ROZEREM ) 8 MG tablet is to expensive with insurance and wants alternative that cheaper that the insurance covers

## 2024-08-20 NOTE — Telephone Encounter (Signed)
 LMOM letting patient know she has a Academic Librarian at [employer] in [place].  JM

## 2024-08-23 ENCOUNTER — Ambulatory Visit: Admitting: Urology

## 2024-08-24 ENCOUNTER — Telehealth: Payer: Self-pay

## 2024-08-24 NOTE — Telephone Encounter (Signed)
 Copied from CRM [id number]. Topic: Clinical - Prescription Issue >> Aug 24, 2024 12:48 PM Amy B wrote: Reason for CRM:   FYI  Patient called stating that she decided not to take the sleep medicine that was prescribed.  It is too expensive.

## 2024-08-24 NOTE — Telephone Encounter (Signed)
 LMOM for patient to call us  back and inform us  if she has picked RX up.  JM

## 2024-08-25 ENCOUNTER — Other Ambulatory Visit: Payer: Self-pay | Admitting: Student

## 2024-08-26 NOTE — Telephone Encounter (Signed)
 Requested Prescriptions  Refused Prescriptions Disp Refills   amitriptyline  (ELAVIL ) 25 MG tablet [Pharmacy Med Name: Amitriptyline  HCl 25 MG Oral Tablet] 14 tablet 0    Sig: TAKE 1 TABLET BY MOUTH AT BEDTIME FOR 14 DAYS. DISCONTINUE AFTER 2 WEEKS.     Psychiatry:  Antidepressants - Heterocyclics (TCAs) Passed - 08/26/2024 11:47 AM      Passed - Completed PHQ-2 or PHQ-9 in the last 360 days      Passed - Valid encounter within last 6 months    Recent Outpatient Visits           1 week ago Primary insomnia   [place] [facility] at [facility], Harlene, MD   1 month ago Major depressive disorder, recurrent episode, moderate (HCC)   [place] [facility] at [facility], MD   2 months ago Pelvic organ prolapse quantification stage 2 cystocele   [facility] at [facility], MD   7 months ago Anxiety and depression   [facility] at [facility], MD       Future Appointments             In 1 month MacDiarmid, Glendia, MD [facility]

## 2024-08-27 ENCOUNTER — Telehealth: Payer: Self-pay | Admitting: Student

## 2024-08-27 ENCOUNTER — Other Ambulatory Visit: Payer: Self-pay | Admitting: Student

## 2024-08-27 DIAGNOSIS — F5101 Primary insomnia: Secondary | ICD-10-CM

## 2024-08-27 MED ORDER — AMITRIPTYLINE HCL 25 MG PO TABS: 25.0000 mg | ORAL_TABLET | Freq: Every evening | ORAL | 0 refills | Status: DC | PRN

## 2024-08-27 NOTE — Telephone Encounter (Signed)
 Prescription sent in for the patient. Called patient to notify her. There was no answer, voicemail message left for patient informing her Rx sent to pharmacy.

## 2024-08-27 NOTE — Telephone Encounter (Signed)
 Copied from CRM #[id number]. Topic: Clinical - Medication Refill >> Aug 27, 2024  8:48 AM Jasmin G wrote: Medication: amitriptyline   Has the patient contacted their pharmacy? No (Agent: If no, request that the patient contact the pharmacy for the refill. If patient does not wish to contact the pharmacy document the reason why and proceed with request.) (Agent: If yes, when and what did the pharmacy advise?)  This is the patient's preferred pharmacy:  [facility] [address] [place] - [address] [place] [address] Phone: [phone] Fax: [phone]  Is this the correct pharmacy for this prescription? Yes If no, delete pharmacy and type the correct one.   Has the prescription been filled recently? No  Is the patient out of the medication? Yes  Has the patient been seen for an appointment in the last year OR does the patient have an upcoming appointment? Yes  Can we respond through MyChart? No  Agent: Please be advised that Rx refills may take up to 3 business days. We ask that you follow-up with your pharmacy.

## 2024-08-27 NOTE — Telephone Encounter (Signed)
 Please review message

## 2024-09-15 ENCOUNTER — Encounter: Payer: Self-pay | Admitting: Urology

## 2024-09-19 ENCOUNTER — Other Ambulatory Visit: Payer: Self-pay | Admitting: Student

## 2024-09-20 NOTE — Telephone Encounter (Signed)
 Requested medications are due for refill today.  yes  Requested medications are on the active medications list.  yes  Last refill. 08/27/2024 #30 0 rf  Future visit scheduled.   yes  Notes to clinic.  New medication to this pt.    Requested Prescriptions  Pending Prescriptions Disp Refills   amitriptyline  (ELAVIL ) 25 MG tablet [Pharmacy Med Name: Amitriptyline  HCl 25 MG Oral Tablet] 30 tablet 0    Sig: TAKE 1 TABLET BY MOUTH AT BEDTIME AS NEEDED FOR SLEEP     Psychiatry:  Antidepressants - Heterocyclics (TCAs) Passed - 09/20/2024  5:18 PM      Passed - Completed PHQ-2 or PHQ-9 in the last 360 days      Passed - Valid encounter within last 6 months    Recent Outpatient Visits           1 month ago Primary insomnia   [place] [facility] at [facility], Harlene, MD   1 month ago Major depressive disorder, recurrent episode, moderate (HCC)   [place] [facility] at [facility], MD   3 months ago Pelvic organ prolapse quantification stage 2 cystocele   [facility] at [facility], MD   8 months ago Anxiety and depression   [facility] at [facility], MD       Future Appointments             In 2 months MacDiarmid, Glendia, MD [facility]

## 2024-09-29 ENCOUNTER — Ambulatory Visit: Admitting: Student

## 2024-09-29 ENCOUNTER — Encounter: Payer: Self-pay | Admitting: Student

## 2024-09-29 VITALS — BP 122/68 | HR 100 | Temp 98.0°F | Ht 59.0 in | Wt 122.5 lb

## 2024-09-29 DIAGNOSIS — N8110 Cystocele, unspecified: Secondary | ICD-10-CM

## 2024-09-29 DIAGNOSIS — F331 Major depressive disorder, recurrent, moderate: Secondary | ICD-10-CM

## 2024-09-29 NOTE — Patient Instructions (Signed)
  We hope you enjoyed your visit with our office! Your feedback means so much to our team, and it helps us  to continue providing the best care possible. If you had a positive experience, we'd love if you could share it by leaving us  a [employer] Review and also completing our patient survey that you'll receive soon.  Your kind words not only brighten our day but also help other patients feel confident in choosing our office for their care.  Thank you for being a part of our practice family!

## 2024-09-29 NOTE — Progress Notes (Signed)
 "  Established Patient Office Visit  Subjective   Patient ID: Michelle Willis, female    DOB: [birth year]  Age: 74 y.o. MRN: [id number]  Chief Complaint  Patient presents with   Referral    Needs referral for physical therapy for pelvic floor strengthening. As found place she has in mind. -  [facility] [address] [place], [place]  [address]  [phone]    GROVER WOODFIELD is a 74 y.o. person with medical hx listed below who presents today for follow up of pelvic organ prolapse. Please refer to problem based charting for further details and assessment and plan of current problem and chronic medical conditions.   Patient Active Problem List   Diagnosis Date Noted   HLD (hyperlipidemia) 07/26/2024   Osteoporosis 07/26/2024   Pelvic organ prolapse quantification stage 2 cystocele 06/09/2024   Gastroesophageal reflux disease with esophagitis without hemorrhage 09/06/2019   Encounter for screening colonoscopy    Primary insomnia 03/13/2017   Essential hypertension 09/30/2016   GAD (generalized anxiety disorder) 09/30/2016   Major depressive disorder, recurrent episode, moderate (HCC) 03/28/2015      ROS Refer to HPI    Objective:     Outpatient Encounter Medications as of 09/29/2024  Medication Sig   amitriptyline  (ELAVIL ) 25 MG tablet TAKE 1 TABLET BY MOUTH AT BEDTIME AS NEEDED FOR SLEEP   Ascorbic Acid (VITAMIN C ADULT GUMMIES PO)    busPIRone  (BUSPAR ) 7.5 MG tablet Take 1 tablet (7.5 mg total) by mouth 2 (two) times daily.   Calcium Carbonate (CALCIUM 600 PO) Take 1 tablet by mouth daily.   Cholecalciferol (VITAMIN D3 ADULT GUMMIES PO) Take 1 tablet by mouth daily.    citalopram  (CELEXA ) 20 MG tablet Take 1 tablet (20 mg total) by mouth daily.   pantoprazole  (PROTONIX ) 40 MG tablet Take 1 tablet (40 mg total) by mouth daily.   Suvorexant  5 MG TABS Take 1 tablet (5 mg total) by mouth at bedtime as needed (insomnia). (Patient not taking:  Reported on 09/29/2024)   No facility-administered encounter medications on file as of 09/29/2024.    BP 122/68   Pulse 100   Temp 98 F (36.7 C) (Oral)   Ht 4' 11 (1.499 m)   Wt 122 lb 8 oz (55.6 kg)   SpO2 97%   BMI 24.74 kg/m  BP Readings from Last 3 Encounters:  09/29/24 122/68  08/13/24 122/80  07/26/24 122/76    Physical Exam Constitutional:      Appearance: Normal appearance.  HENT:     Mouth/Throat:     Mouth: Mucous membranes are moist.     Pharynx: Oropharynx is clear.  Eyes:     Extraocular Movements: Extraocular movements intact.     Pupils: Pupils are equal, round, and reactive to light.  Cardiovascular:     Rate and Rhythm: Normal rate and regular rhythm.  Pulmonary:     Effort: Pulmonary effort is normal.     Breath sounds: No rhonchi or rales.  Abdominal:     General: Abdomen is flat. Bowel sounds are normal. There is no distension.     Palpations: Abdomen is soft.     Tenderness: There is no abdominal tenderness. There is no right CVA tenderness or left CVA tenderness.  Musculoskeletal:        General: Normal range of motion.     Right lower leg: No edema.     Left lower leg: No edema.  Skin:  General: Skin is warm and dry.     Capillary Refill: Capillary refill takes less than 2 seconds.  Neurological:     General: No focal deficit present.     Mental Status: She is alert and oriented to person, place, and time.  Psychiatric:        Mood and Affect: Mood normal.        Behavior: Behavior normal.        09/29/2024    8:19 AM 08/13/2024    1:59 PM 07/26/2024    9:46 AM  Depression screen PHQ 2/9  Decreased Interest 0 0 0  Down, Depressed, Hopeless 0 0 0  PHQ - 2 Score 0 0 0  Altered sleeping 1  2  Tired, decreased energy 0  2  Change in appetite 0  0  Feeling bad or failure about yourself  0  0  Trouble concentrating 1  2  Moving slowly or fidgety/restless 1  0  Suicidal thoughts 0  0  PHQ-9 Score 3  6  Difficult doing work/chores    Somewhat difficult       09/29/2024    8:20 AM 07/26/2024    9:46 AM 06/09/2024   11:25 AM 08/04/2023    2:34 PM  GAD 7 : Generalized Anxiety Score  Nervous, Anxious, on Edge 1 0  0  0   Control/stop worrying 0 0  0  0   Worry too much - different things 0 0  0  0   Trouble relaxing 0 0  0  0   Restless 0 0  0  0   Easily annoyed or irritable 0 1  0  0   Afraid - awful might happen 0 0  0  0   Total GAD 7 Score 1 1 0 0  Anxiety Difficulty  Somewhat difficult Not difficult at all Not difficult at all     Data saved with a previous flowsheet row definition    No results found for any visits on 09/29/24.    The 10-year ASCVD risk score (Arnett DK, et al., 2019) is: 14.7%    Assessment & Plan:  Pelvic organ prolapse quantification stage 2 cystocele Assessment & Plan: Urogynecology appointment in January rescheduled to 3/19 due to weather. Would like to trial pelvic floor therapy. Saw Dr. Justino in November and had suggest pelvic floor therapy. Referral to PT for pelvic floor therapy.   Orders: -     Ambulatory referral to Physical Therapy  Major depressive disorder, recurrent episode, moderate (HCC) Assessment & Plan: PHQ9 3. Stable continue on celexa  20 mg daily.        Harlene Saddler, MD "

## 2024-09-29 NOTE — Assessment & Plan Note (Addendum)
 Urogynecology appointment in January rescheduled to 3/19 due to weather. Would like to trial pelvic floor therapy. Saw Dr. Justino in November and had suggest pelvic floor therapy. Referral to PT for pelvic floor therapy.

## 2024-09-29 NOTE — Assessment & Plan Note (Signed)
 PHQ9 3. Stable continue on celexa  20 mg daily.

## 2024-10-04 ENCOUNTER — Ambulatory Visit: Admitting: Urology

## 2024-11-29 ENCOUNTER — Ambulatory Visit: Admitting: Urology

## 2024-11-30 ENCOUNTER — Encounter: Admitting: Student
